# Patient Record
Sex: Male | Born: 1935 | Race: White | Hispanic: No | Smoking: Never smoker
Health system: Southern US, Community
[De-identification: ages and names within clinical notes are randomized; demographics above are authoritative.]

## PROBLEM LIST (undated history)

## (undated) DIAGNOSIS — I10 Essential (primary) hypertension: Secondary | ICD-10-CM

## (undated) DIAGNOSIS — I4891 Unspecified atrial fibrillation: Secondary | ICD-10-CM

## (undated) DIAGNOSIS — C189 Malignant neoplasm of colon, unspecified: Secondary | ICD-10-CM

## (undated) DIAGNOSIS — N189 Chronic kidney disease, unspecified: Secondary | ICD-10-CM

## (undated) DIAGNOSIS — K529 Noninfective gastroenteritis and colitis, unspecified: Secondary | ICD-10-CM

## (undated) DIAGNOSIS — I2510 Atherosclerotic heart disease of native coronary artery without angina pectoris: Secondary | ICD-10-CM

---

## 2012-01-21 DEATH — deceased

## 2016-11-17 ENCOUNTER — Encounter (HOSPITAL_COMMUNITY): Payer: Self-pay | Admitting: Physician Assistant

## 2016-11-17 ENCOUNTER — Inpatient Hospital Stay (HOSPITAL_COMMUNITY)
Admission: AD | Admit: 2016-11-17 | Discharge: 2016-11-23 | DRG: 375 | Disposition: A | Discharge status: Home Health | Payer: Medicare Other | Source: Other Acute Inpatient Hospital | Attending: Nephrology | Admitting: Nephrology

## 2016-11-17 DIAGNOSIS — I272 Pulmonary hypertension, unspecified: Secondary | ICD-10-CM | POA: Diagnosis present

## 2016-11-17 DIAGNOSIS — I25810 Atherosclerosis of coronary artery bypass graft(s) without angina pectoris: Secondary | ICD-10-CM | POA: Diagnosis not present

## 2016-11-17 DIAGNOSIS — K922 Gastrointestinal hemorrhage, unspecified: Secondary | ICD-10-CM | POA: Diagnosis present

## 2016-11-17 DIAGNOSIS — Z923 Personal history of irradiation: Secondary | ICD-10-CM

## 2016-11-17 DIAGNOSIS — J449 Chronic obstructive pulmonary disease, unspecified: Secondary | ICD-10-CM | POA: Diagnosis present

## 2016-11-17 DIAGNOSIS — I129 Hypertensive chronic kidney disease with stage 1 through stage 4 chronic kidney disease, or unspecified chronic kidney disease: Secondary | ICD-10-CM | POA: Diagnosis present

## 2016-11-17 DIAGNOSIS — Z7982 Long term (current) use of aspirin: Secondary | ICD-10-CM | POA: Diagnosis not present

## 2016-11-17 DIAGNOSIS — I10 Essential (primary) hypertension: Secondary | ICD-10-CM

## 2016-11-17 DIAGNOSIS — K921 Melena: Secondary | ICD-10-CM | POA: Diagnosis present

## 2016-11-17 DIAGNOSIS — D62 Acute posthemorrhagic anemia: Secondary | ICD-10-CM | POA: Diagnosis present

## 2016-11-17 DIAGNOSIS — C180 Malignant neoplasm of cecum: Secondary | ICD-10-CM | POA: Diagnosis not present

## 2016-11-17 DIAGNOSIS — I4891 Unspecified atrial fibrillation: Secondary | ICD-10-CM

## 2016-11-17 DIAGNOSIS — I959 Hypotension, unspecified: Secondary | ICD-10-CM | POA: Diagnosis present

## 2016-11-17 DIAGNOSIS — Z87891 Personal history of nicotine dependence: Secondary | ICD-10-CM

## 2016-11-17 DIAGNOSIS — K625 Hemorrhage of anus and rectum: Secondary | ICD-10-CM | POA: Diagnosis not present

## 2016-11-17 DIAGNOSIS — K317 Polyp of stomach and duodenum: Secondary | ICD-10-CM | POA: Diagnosis present

## 2016-11-17 DIAGNOSIS — N179 Acute kidney failure, unspecified: Secondary | ICD-10-CM | POA: Diagnosis present

## 2016-11-17 DIAGNOSIS — Z809 Family history of malignant neoplasm, unspecified: Secondary | ICD-10-CM | POA: Diagnosis not present

## 2016-11-17 DIAGNOSIS — R109 Unspecified abdominal pain: Secondary | ICD-10-CM

## 2016-11-17 DIAGNOSIS — C189 Malignant neoplasm of colon, unspecified: Principal | ICD-10-CM | POA: Diagnosis present

## 2016-11-17 DIAGNOSIS — I480 Paroxysmal atrial fibrillation: Secondary | ICD-10-CM | POA: Diagnosis present

## 2016-11-17 DIAGNOSIS — N184 Chronic kidney disease, stage 4 (severe): Secondary | ICD-10-CM | POA: Diagnosis present

## 2016-11-17 DIAGNOSIS — R1031 Right lower quadrant pain: Secondary | ICD-10-CM | POA: Diagnosis not present

## 2016-11-17 DIAGNOSIS — K2970 Gastritis, unspecified, without bleeding: Secondary | ICD-10-CM | POA: Diagnosis present

## 2016-11-17 DIAGNOSIS — K5150 Left sided colitis without complications: Secondary | ICD-10-CM | POA: Diagnosis present

## 2016-11-17 DIAGNOSIS — N189 Chronic kidney disease, unspecified: Secondary | ICD-10-CM | POA: Insufficient documentation

## 2016-11-17 HISTORY — DX: Unspecified atrial fibrillation: I48.91

## 2016-11-17 HISTORY — DX: Essential (primary) hypertension: I10

## 2016-11-17 HISTORY — DX: Noninfective gastroenteritis and colitis, unspecified: K52.9

## 2016-11-17 HISTORY — DX: Atherosclerotic heart disease of native coronary artery without angina pectoris: I25.10

## 2016-11-17 HISTORY — DX: Chronic kidney disease, unspecified: N18.9

## 2016-11-17 HISTORY — DX: Malignant neoplasm of colon, unspecified: C18.9

## 2016-11-17 LAB — COMPREHENSIVE METABOLIC PANEL
ALK PHOS: 38 U/L (ref 38–126)
ALT: 8 U/L — ABNORMAL LOW (ref 17–63)
ANION GAP: 11 (ref 5–15)
AST: 15 U/L (ref 15–41)
Albumin: 2.4 g/dL — ABNORMAL LOW (ref 3.5–5.0)
BUN: 47 mg/dL — ABNORMAL HIGH (ref 6–20)
CALCIUM: 8.6 mg/dL — AB (ref 8.9–10.3)
CO2: 21 mmol/L — ABNORMAL LOW (ref 22–32)
Chloride: 102 mmol/L (ref 101–111)
Creatinine, Ser: 2.84 mg/dL — ABNORMAL HIGH (ref 0.61–1.24)
GFR, EST AFRICAN AMERICAN: 20 mL/min — AB (ref >60)
GFR, EST NON AFRICAN AMERICAN: 18 mL/min — AB (ref >60)
GLUCOSE: 94 mg/dL (ref 65–99)
POTASSIUM: 4.5 mmol/L (ref 3.5–5.1)
Sodium: 134 mmol/L — ABNORMAL LOW (ref 135–145)
TOTAL PROTEIN: 5.4 g/dL — AB (ref 6.5–8.1)
Total Bilirubin: 0.4 mg/dL (ref 0.3–1.2)

## 2016-11-17 LAB — CBC WITH DIFFERENTIAL/PLATELET
BASOS ABS: 0 10*3/uL (ref 0.0–0.1)
BASOS PCT: 0 %
Eosinophils Absolute: 0.3 10*3/uL (ref 0.0–0.7)
Eosinophils Relative: 2 %
HEMATOCRIT: 27 % — AB (ref 39.0–52.0)
HEMOGLOBIN: 8.8 g/dL — AB (ref 13.0–17.0)
LYMPHS PCT: 10 %
Lymphs Abs: 1.5 10*3/uL (ref 0.7–4.0)
MCH: 31.9 pg (ref 26.0–34.0)
MCHC: 32.6 g/dL (ref 30.0–36.0)
MCV: 97.8 fL (ref 78.0–100.0)
Monocytes Absolute: 0.9 10*3/uL (ref 0.1–1.0)
Monocytes Relative: 6 %
NEUTROS ABS: 13.2 10*3/uL — AB (ref 1.7–7.7)
NEUTROS PCT: 82 %
Platelets: 245 10*3/uL (ref 150–400)
RBC: 2.76 MIL/uL — AB (ref 4.22–5.81)
RDW: 13.5 % (ref 11.5–15.5)
WBC: 16 10*3/uL — AB (ref 4.0–10.5)

## 2016-11-17 LAB — PROTIME-INR
INR: 1.13
Prothrombin Time: 14.5 seconds (ref 11.4–15.2)

## 2016-11-17 LAB — GLUCOSE, CAPILLARY
Glucose-Capillary: 86 mg/dL (ref 65–99)
Glucose-Capillary: 114 mg/dL — ABNORMAL HIGH (ref 65–99)
Glucose-Capillary: 101 mg/dL — ABNORMAL HIGH (ref 65–99)

## 2016-11-17 LAB — HEMATOCRIT
HEMATOCRIT: 27.4 % — AB (ref 39.0–52.0)
HEMATOCRIT: 26 % — AB (ref 39.0–52.0)

## 2016-11-17 LAB — ABO/RH: ABO/RH(D): A POS

## 2016-11-17 MED ORDER — NITROGLYCERIN 0.4 MG SL SUBL: 0.4000 mg | SUBLINGUAL_TABLET | SUBLINGUAL | Status: DC | PRN

## 2016-11-17 MED ORDER — OMEGA-3-ACID ETHYL ESTERS 1 G PO CAPS
1.0000 g | ORAL_CAPSULE | Freq: Every day | ORAL | Status: DC
  Administered 2016-11-17 – 2016-11-23 (×6): 1 g via ORAL
  Filled 2016-11-17 (×7): qty 1

## 2016-11-17 MED ORDER — TRAZODONE HCL 50 MG PO TABS
25.0000 mg | ORAL_TABLET | Freq: Every evening | ORAL | Status: DC | PRN
  Filled 2016-11-17: qty 1

## 2016-11-17 MED ORDER — ONDANSETRON HCL 4 MG PO TABS: 4.0000 mg | ORAL_TABLET | Freq: Four times a day (QID) | ORAL | Status: DC | PRN

## 2016-11-17 MED ORDER — ACETAMINOPHEN 500 MG PO TABS
500.0000 mg | ORAL_TABLET | Freq: Every evening | ORAL | Status: DC | PRN
  Filled 2016-11-17: qty 1

## 2016-11-17 MED ORDER — PIPERACILLIN-TAZOBACTAM IN DEX 2-0.25 GM/50ML IV SOLN
2.2500 g | Freq: Three times a day (TID) | INTRAVENOUS | Status: DC
  Administered 2016-11-17 – 2016-11-23 (×18): 2.25 g via INTRAVENOUS
  Filled 2016-11-17 (×19): qty 50

## 2016-11-17 MED ORDER — SODIUM CHLORIDE 0.9 % IV SOLN: Freq: Once | INTRAVENOUS | Status: DC

## 2016-11-17 MED ORDER — ACETAMINOPHEN 325 MG PO TABS: 650.0000 mg | ORAL_TABLET | Freq: Four times a day (QID) | ORAL | Status: DC | PRN

## 2016-11-17 MED ORDER — PANTOPRAZOLE SODIUM 40 MG PO TBEC
40.0000 mg | DELAYED_RELEASE_TABLET | Freq: Every day | ORAL | Status: DC
  Administered 2016-11-17: 40 mg via ORAL
  Filled 2016-11-17: qty 1

## 2016-11-17 MED ORDER — FUROSEMIDE 20 MG PO TABS
20.0000 mg | ORAL_TABLET | Freq: Every day | ORAL | Status: DC
  Administered 2016-11-17 – 2016-11-18 (×2): 20 mg via ORAL
  Filled 2016-11-17 (×2): qty 1

## 2016-11-17 MED ORDER — ONDANSETRON HCL 4 MG/2ML IJ SOLN: 4.0000 mg | Freq: Four times a day (QID) | INTRAMUSCULAR | Status: DC | PRN

## 2016-11-17 MED ORDER — SODIUM CHLORIDE 0.9 % IV SOLN
INTRAVENOUS | Status: DC
  Administered 2016-11-17 (×2): via INTRAVENOUS

## 2016-11-17 MED ORDER — ACETAMINOPHEN 650 MG RE SUPP: 650.0000 mg | Freq: Four times a day (QID) | RECTAL | Status: DC | PRN

## 2016-11-17 MED ORDER — PANTOPRAZOLE SODIUM 40 MG IV SOLR
40.0000 mg | Freq: Two times a day (BID) | INTRAVENOUS | Status: DC
  Administered 2016-11-17 – 2016-11-20 (×7): 40 mg via INTRAVENOUS
  Filled 2016-11-17 (×7): qty 40

## 2016-11-17 MED ORDER — PIPERACILLIN-TAZOBACTAM 3.375 G IVPB 30 MIN
3.3750 g | Freq: Three times a day (TID) | INTRAVENOUS | Status: DC
  Filled 2016-11-17 (×2): qty 50

## 2016-11-17 MED ORDER — METOPROLOL TARTRATE 50 MG PO TABS
75.0000 mg | ORAL_TABLET | Freq: Two times a day (BID) | ORAL | Status: DC
  Administered 2016-11-17 – 2016-11-18 (×2): 75 mg via ORAL
  Filled 2016-11-17 (×2): qty 1

## 2016-11-17 NOTE — Evaluation (Signed)
Physical Therapy Evaluation Patient Details Name: Leroy Soto MRN: [id number] DOB: 1920/06/22 Today's Date: 11/17/2016   History of Present Illness  pt is 83 y/o male with pmh of HTN, colon CA, s/p rdx, colitis 10 days PTA, presenting from outside hospital due to acute GIB/bloody diarrhea.  Clinical Impression  Pt admitted with/for GIB. Pt notably weak and needs min assist for mobility.  Pt currently limited functionally due to the problems listed below.  (see problems list.)  Pt will benefit from PT to maximize function and safety to be able to get home safely with available assist of family.     Follow Up Recommendations Home health PT;Supervision for mobility/OOB    Equipment Recommendations  None recommended by PT    Recommendations for Other Services       Precautions / Restrictions        Mobility  Bed Mobility Overal bed mobility: Needs Assistance Bed Mobility: Supine to Sit;Sit to Supine     Supine to sit: Min assist Sit to supine: Min guard   General bed mobility comments: stuggled coming OOB  Transfers Overall transfer level: Needs assistance   Transfers: Sit to/from Stand Sit to Stand: Min assist         General transfer comment: cues for hand placement  Ambulation/Gait Ambulation/Gait assistance: Min assist Ambulation Distance (Feet): 25 Feet Assistive device: Rolling walker (2 wheeled) Gait Pattern/deviations: Step-through pattern Gait velocity: slower Gait velocity interpretation: Below normal speed for age/gender General Gait Details: weak-kneed gait with mild knee flexion consistently.  Moderate use of the RW and stability assist overall.  Stairs            Wheelchair Mobility    Modified Rankin (Stroke Patients Only)       Balance Overall balance assessment: Needs assistance   Sitting balance-Leahy Scale: Good       Standing balance-Leahy Scale: Poor Standing balance comment: reliant on the RW                              Pertinent Vitals/Pain Pain Assessment: No/denies pain    Home Living Family/patient expects to be discharged to:: Private residence Living Arrangements: Children Available Help at Discharge: Family;Available 24 hours/day Type of Home: House Home Access: Stairs to enter Entrance Stairs-Rails: Psychiatric nurse of Steps: 3 Home Layout: One level Home Equipment: Shower seat;Other (comment);Walker - 2 wheels;Cane - single point (toilet riser with handles)      Prior Function Level of Independence: Independent with assistive device(s)               Hand Dominance        Extremity/Trunk Assessment   Upper Extremity Assessment Upper Extremity Assessment: Defer to OT evaluation    Lower Extremity Assessment Lower Extremity Assessment: Overall WFL for tasks assessed (proximal weakness--hip flexors, hams.)       Communication   Communication: No difficulties;HOH  Cognition Arousal/Alertness: Awake/alert Behavior During Therapy: WFL for tasks assessed/performed Overall Cognitive Status: Within Functional Limits for tasks assessed                      General Comments      Exercises     Assessment/Plan    PT Assessment Patient needs continued PT services  PT Problem List Decreased strength;Decreased activity tolerance;Decreased balance;Decreased mobility;Decreased knowledge of use of DME          PT Treatment Interventions Gait training;Stair training;Functional  mobility training;DME instruction;Therapeutic activities;Balance training;Patient/family education    PT Goals (Current goals can be found in the Care Plan section)  Acute Rehab PT Goals Patient Stated Goal: Get home....get stronger and give [place] some information PT Goal Formulation: With patient/family Time For Goal Achievement: 12/01/16 Potential to Achieve Goals: Good    Frequency Min 3X/week   Barriers to discharge        Co-evaluation                End of Session     Patient left: in bed;with call bell/phone within reach;with family/visitor present Nurse Communication: Mobility status         Time: [id number] PT Time Calculation (min) (ACUTE ONLY): 27 min   Charges:   PT Evaluation $PT Eval Moderate Complexity: 1 Procedure PT Treatments $Gait Training: 8-22 mins   PT G CodesTessie Fass Camreigh Michie 11/17/2016, 4:00 PM 11/17/2016  Donnella Sham, [place] [phone]  (pager)

## 2016-11-17 NOTE — H&P (Signed)
History and Physical    Maximum Murnan [id number] DOB: 02/13/1920 DOA: 11/17/2016  PCP: Default, Provider, MD   Patient coming from: [facility]  Chief Complaint: abdominal apin, nausea, bloody stools  HPI: Leroy Soto is a 83 y.o. male with medical history significant of HTN  [place] - onset at 8:20 pm. In the outside facility his blood work demonstrated anemia with Hgb 9.0., WBC count of 19,790, creatinine of 2.78, BUN 53 and Na 133 Rectal exam was grossly bloody. He was transferred to the [facility] d/t absence of on-call GI specialist in [facility] Patient has been seen by PCP on 11/09/2015 for abdominal pain, underwent abdominal CT showing left sided colitis, started on oral Cipro an Flagyl with improvement of symptoms after almost 10-day course of antibiotic therapy  Review of Systems: As per HPI otherwise 10 point review of systems negative.   Ambulatory Status: ambulated with walker at home and cane outside  Past Medical History:  Diagnosis Date  . A-fib ([place])   . CAD (coronary artery disease)   . CKD (chronic kidney disease)   . Colitis   . Colon cancer ([place])   . HTN (hypertension)     No past surgical history on file.  Social History   Social History  . Marital status: Widowed    Spouse name: N/A  . Number of children: N/A  . Years of education: N/A   Occupational History  . Not on file.   Social History Main Topics  . Smoking status: Not on file  . Smokeless tobacco: Not on file  . Alcohol use Not on file  . Drug use: Unknown  . Sexual activity: Not on file   Other Topics Concern  . Not on file   Social History Narrative  . No narrative on file    No Known Allergies  Family History  Problem Relation Age of Onset  . Cancer Father     Prior to Admission medications   Not on File    Physical Exam: Vitals:   11/17/16 0901  BP: 116/67  Pulse: 88    Resp: 18  Temp: 98.6 F (37 C)  TempSrc: Oral  SpO2: 98%  Weight: 68.8 kg (151 lb 10.8 oz)     General: Appears calm and comfortable, but pale and ill llooking Eyes: PERRLA, EOMI, normal lids, iris ENT:  decreased hearing, lips & tongue, mucous membranes moist and intact Neck: no lymphoadenopathy, masses or thyromegaly Cardiovascular: RRR, no m/r/g. No JVD, carotid bruits. No LE edema.  Respiratory: bilateral no wheezes, rales, rhonchi or cracles. Normal respiratory effort. No accessory muscle use observed Abdomen: mildly distended, tender to palpation in the LLQ, no organomegaly or masses appreciated. BS present in all quadrants  Skin: no rash, ulcers or induration seen on limited exam Musculoskeletal: grossly normal tone BUE/BLE, good ROM, no bony abnormality or joint deformities observed Psychiatric: grossly normal mood and affect, speech fluent and appropriate, alert and oriented x3 Neurologic: CN II-XII grossly intact, moves all extremities in coordinated fashion, sensation intact  Labs on Admission: I have personally reviewed following labs and imaging studies  CBC, BMP  GFR: CrCl cannot be calculated (Unknown ideal weight.).   Creatinine Clearance: CrCl cannot be calculated (Unknown ideal weight.).   Radiological Exams on Admission: No results found.  EKG: Independently reviewed - pending Assessment/Plan Principal Problem:   GIB (gastrointestinal bleeding) Active Problems:   Acute blood loss anemia  HTN (hypertension)   CKD (chronic kidney disease)   CAD (coronary artery disease) of artery bypass graft   A-fib ([facility])   GIB with associated blood loss anemia Continuee clear liquid diet, provide pain control and antiwmwtics if needed Contact GI service for consultation Will type and screen RBC's in case patient requires transfusion Continue to monitor HH q 6 hours and transfuse if Hgb drops to 8.0 or patient becomes hemodynamically unstable   Abdominal  pain Patient is still tender to palpation in the LLQ and has diffuse mild distension Will repeat abdominal CT and start IV Zosyn given his elevated WBC count  CKD - baseline creatinine is unknown Will lcontinue to monitor renal indices. Avoid nephrotoxic drugs  Hypertension - currently stable Continue home medication and adjust the doses if needed depending on the BP readings  Afib EKG is pending, HR is controlled presently Patient was treated with Eliquis, but lately it was changed to full dose Aspirin Will hold aspirin in light of active GI bleeding   DVT prophylaxis: SCd's Code Status: Full Family Communication: none Disposition Plan: MedSurg Consults called: GI Admission status: Inpatient   York Grice, [place] Pager: [phone] [facility]  If 7PM-7AM, please contact night-coverage [email or web address] Password [facility]  11/17/2016, 12:07 PM

## 2016-11-17 NOTE — Progress Notes (Signed)
Pharmacy Antibiotic Note  Leroy Soto is a 83 y.o. male admitted on 11/17/2016 with GI bleed and concern for intra-abdominal infection.  Pharmacy has been consulted for zosyn  dosing.  Plan: -Zosyn 2.25g Q8H with CrCl <20 -Monitor renal function, patient progress, LOT  Weight: 151 lb 10.8 oz (68.8 kg)  Temp (24hrs), Avg:98.6 F (37 C), Min:98.6 F (37 C), Max:98.6 F (37 C)   Recent Labs Lab 11/17/16 1002  WBC 16.0*  CREATININE 2.84*    CrCl cannot be calculated (Unknown ideal weight.).    No Known Allergies  Antimicrobials this admission: Zosyn 1/27 >>  Dose adjustments this admission: none  Microbiology results: none  Thank you for allowing pharmacy to be a part of this patient's care.  Myer Peer Grayland Ormond), PharmD  PGY1 Pharmacy Resident Pager: [phone] 11/17/2016 3:07 PM

## 2016-11-18 ENCOUNTER — Encounter (HOSPITAL_COMMUNITY): Payer: Self-pay | Admitting: *Deleted

## 2016-11-18 ENCOUNTER — Inpatient Hospital Stay (HOSPITAL_COMMUNITY): Payer: Medicare Other

## 2016-11-18 DIAGNOSIS — K922 Gastrointestinal hemorrhage, unspecified: Secondary | ICD-10-CM | POA: Diagnosis present

## 2016-11-18 DIAGNOSIS — R1031 Right lower quadrant pain: Secondary | ICD-10-CM

## 2016-11-18 DIAGNOSIS — R935 Abnormal findings on diagnostic imaging of other abdominal regions, including retroperitoneum: Secondary | ICD-10-CM

## 2016-11-18 DIAGNOSIS — C180 Malignant neoplasm of cecum: Secondary | ICD-10-CM

## 2016-11-18 DIAGNOSIS — D62 Acute posthemorrhagic anemia: Secondary | ICD-10-CM

## 2016-11-18 DIAGNOSIS — K921 Melena: Secondary | ICD-10-CM

## 2016-11-18 LAB — COMPREHENSIVE METABOLIC PANEL
ALT: 9 U/L — AB (ref 17–63)
AST: 16 U/L (ref 15–41)
Albumin: 2.2 g/dL — ABNORMAL LOW (ref 3.5–5.0)
Alkaline Phosphatase: 36 U/L — ABNORMAL LOW (ref 38–126)
Anion gap: 8 (ref 5–15)
BUN: 38 mg/dL — ABNORMAL HIGH (ref 6–20)
CHLORIDE: 107 mmol/L (ref 101–111)
CO2: 18 mmol/L — AB (ref 22–32)
CREATININE: 2.42 mg/dL — AB (ref 0.61–1.24)
Calcium: 8 mg/dL — ABNORMAL LOW (ref 8.9–10.3)
GFR calc Af Amer: 25 mL/min — ABNORMAL LOW (ref >60)
GFR calc non Af Amer: 21 mL/min — ABNORMAL LOW (ref >60)
Glucose, Bld: 87 mg/dL (ref 65–99)
Potassium: 4.2 mmol/L (ref 3.5–5.1)
Sodium: 133 mmol/L — ABNORMAL LOW (ref 135–145)
Total Bilirubin: 0.6 mg/dL (ref 0.3–1.2)
Total Protein: 5.1 g/dL — ABNORMAL LOW (ref 6.5–8.1)

## 2016-11-18 LAB — CBC
HCT: 25.6 % — ABNORMAL LOW (ref 39.0–52.0)
HEMATOCRIT: 25.6 % — AB (ref 39.0–52.0)
Hemoglobin: 8.4 g/dL — ABNORMAL LOW (ref 13.0–17.0)
Hemoglobin: 8.2 g/dL — ABNORMAL LOW (ref 13.0–17.0)
MCH: 32.4 pg (ref 26.0–34.0)
MCH: 32 pg (ref 26.0–34.0)
MCHC: 32.8 g/dL (ref 30.0–36.0)
MCHC: 32 g/dL (ref 30.0–36.0)
MCV: 98.8 fL (ref 78.0–100.0)
MCV: 100 fL (ref 78.0–100.0)
PLATELETS: 267 10*3/uL (ref 150–400)
Platelets: 290 10*3/uL (ref 150–400)
RBC: 2.59 MIL/uL — ABNORMAL LOW (ref 4.22–5.81)
RBC: 2.56 MIL/uL — ABNORMAL LOW (ref 4.22–5.81)
RDW: 14.1 % (ref 11.5–15.5)
RDW: 14 % (ref 11.5–15.5)
WBC: 17.7 10*3/uL — ABNORMAL HIGH (ref 4.0–10.5)
WBC: 21.9 10*3/uL — AB (ref 4.0–10.5)

## 2016-11-18 LAB — GLUCOSE, CAPILLARY
GLUCOSE-CAPILLARY: 87 mg/dL (ref 65–99)
Glucose-Capillary: 72 mg/dL (ref 65–99)
Glucose-Capillary: 103 mg/dL — ABNORMAL HIGH (ref 65–99)
Glucose-Capillary: 105 mg/dL — ABNORMAL HIGH (ref 65–99)

## 2016-11-18 LAB — HEMATOCRIT: HCT: 26.1 % — ABNORMAL LOW (ref 39.0–52.0)

## 2016-11-18 MED ORDER — LEVALBUTEROL HCL 0.63 MG/3ML IN NEBU: 0.6300 mg | INHALATION_SOLUTION | Freq: Four times a day (QID) | RESPIRATORY_TRACT | Status: DC | PRN

## 2016-11-18 MED ORDER — IOPAMIDOL (ISOVUE-300) INJECTION 61%
INTRAVENOUS | Status: AC
  Administered 2016-11-18: 30 mL
  Filled 2016-11-18: qty 30

## 2016-11-18 MED ORDER — METOPROLOL TARTRATE 50 MG PO TABS
75.0000 mg | ORAL_TABLET | Freq: Two times a day (BID) | ORAL | Status: DC
  Filled 2016-11-18: qty 1

## 2016-11-18 MED ORDER — SODIUM CHLORIDE 0.9 % IV SOLN
INTRAVENOUS | Status: DC
  Administered 2016-11-19: 01:00:00 via INTRAVENOUS

## 2016-11-18 MED ORDER — IPRATROPIUM-ALBUTEROL 0.5-2.5 (3) MG/3ML IN SOLN
3.0000 mL | RESPIRATORY_TRACT | Status: DC
  Filled 2016-11-18: qty 3

## 2016-11-18 MED ORDER — ALBUTEROL SULFATE (2.5 MG/3ML) 0.083% IN NEBU: 2.5000 mg | INHALATION_SOLUTION | RESPIRATORY_TRACT | Status: DC | PRN

## 2016-11-18 MED ORDER — METHYLPREDNISOLONE SODIUM SUCC 125 MG IJ SOLR
80.0000 mg | Freq: Once | INTRAMUSCULAR | Status: AC
  Administered 2016-11-18: 80 mg via INTRAVENOUS
  Filled 2016-11-18: qty 2

## 2016-11-18 NOTE — Progress Notes (Signed)
PROGRESS NOTE                                                                                                                                                                                                             Patient Demographics:    Leroy Soto, is a 83 y.o. male, DOB - 18-Sep-1920, [id number]  Admit date - 11/17/2016   Admitting Physician Maren Reamer, MD  Outpatient Primary MD for the patient is Default, Provider, MD  LOS - 1  Outpatient Specialists: [place]  No chief complaint on file.      Brief Narrative   83 year old male with history of hypertension, CK D stage III, moderate arthritis, history of infected hip, most recent echo 2016 with EF 50%, severe pulmonary hypertension, history of cecal adenocarcinoma status post radiation therapy, he is here stress test 1/17 positive for ischemia, EF 33%,. - Agent presents from [facility] ED with complaints of bloody diarrhea, baseline hemoglobin 10.5 last week, was 9 today, seen by GI, as well patient recently on Cipro and Flagyl by PCP for treatment of colitis.   Subjective:    Leroy Soto today has, No headache, No chest pain, No abdominal pain - No Nausea, Report some shortness of breath, had some wheezing   Assessment  & Plan :    Principal Problem:   GIB (gastrointestinal bleeding) Active Problems:   Acute blood loss anemia   HTN (hypertension)   CKD (chronic kidney disease)   CAD (coronary artery disease) of artery bypass graft   A-fib (HCC)   Abdominal pain   GI bleed   GI bleed with blood loss anemia - GI input greatly appreciated, history of colon cancer in February 2017, status post palliative radiation, not a surgical candidate. - Hemoccult-positive stool , glycohemoglobin 10.5 last week, it is 8.4 today. - Probably upper GI bleed given dark-colored stool, heme positive, and plan for EGD per GI service, continue with clear liquid diet, meanwhile continue with  PPI. - Monitor H&H closely and transfuse as needed   Colitis - Patient was started on Cipro and Flagyl by PCP a days ago, repeat CT abdomen pelvis today significant for right-sided colitis infectious versus cancer. - Continue Zosyn.  COPD - Recently quit smoking 4 years ago, CT significant for interstitial lung disease, some wheezing today, will start on DuoNeb, will give  one dose of Solu-Medrol.   AKI on CKD stage IV - Baseline creatinine is 2.1,it is 2.8 on admission, improving with IV fluids  A. Fib - Not anticoagulation candidate given GI bleed, currently aspirin on hold  Hypertension - Soft blood pressure, will put parameters for metoprolol  Code Status : full  Family Communication  : Son at bedside  Disposition Plan  : Home [place]  Consults  :  GI  Procedures  : none  DVT Prophylaxis  :   SCDs   Lab Results  Component Value Date   PLT 267 11/18/2016    Antibiotics  :    Anti-infectives    Start     Dose/Rate Route Frequency Ordered Stop   11/17/16 1500  piperacillin-tazobactam (ZOSYN) IVPB 2.25 g     2.25 g 100 mL/hr over 30 Minutes Intravenous Every 8 hours 11/17/16 1459     11/17/16 1400  piperacillin-tazobactam (ZOSYN) IVPB 3.375 g  Status:  Discontinued     3.375 g 12.5 mL/hr over 240 Minutes Intravenous Every 8 hours 11/17/16 1245 11/17/16 1459        Objective:   Vitals:   11/17/16 1857 11/17/16 2040 11/18/16 0606 11/18/16 0854  BP:  109/67 (!) 91/58 111/63  Pulse:  74 78 86  Resp:  16 16 18   Temp:   98.4 F (36.9 C) 98.6 F (37 C)  TempSrc:   Oral Oral  SpO2:  99% 99% 98%  Weight:  68.1 kg (150 lb 2.1 oz)    Height: 5\' 10"  (1.778 m)       Wt Readings from Last 3 Encounters:  11/17/16 68.1 kg (150 lb 2.1 oz)     Intake/Output Summary (Last 24 hours) at 11/18/16 1534 Last data filed at 11/18/16 1135  Gross per 24 hour  Intake             3060 ml  Output             1200 ml  Net             1860 ml     Physical  Exam  Awake Alert, Oriented X 3Extremely hard of hearing Supple Neck,No JVD,  Symmetrical Chest wall movement, Good air movement bilaterally, scattered bilateral wheezing RRR,No Gallops,Rubs or new Murmurs, No Parasternal Heave +ve B.Sounds, Abd Soft, No tenderness, No rebound - guarding or rigidity. No Cyanosis, Clubbing or edema, No new Rash or bruise      Data Review:    CBC  Recent Labs Lab 11/17/16 1002 11/17/16 1448 11/17/16 2128 11/18/16 0430 11/18/16 1044  WBC 16.0*  --   --  17.7*  --   HGB 8.8*  --   --  8.4*  --   HCT 27.0* 27.4* 26.0* 25.6* 26.1*  PLT 245  --   --  267  --   MCV 97.8  --   --  98.8  --   MCH 31.9  --   --  32.4  --   MCHC 32.6  --   --  32.8  --   RDW 13.5  --   --  14.1  --   LYMPHSABS 1.5  --   --   --   --   MONOABS 0.9  --   --   --   --   EOSABS 0.3  --   --   --   --   BASOSABS 0.0  --   --   --   --  Chemistries   Recent Labs Lab 11/17/16 1002 11/18/16 0430  NA 134* 133*  K 4.5 4.2  CL 102 107  CO2 21* 18*  GLUCOSE 94 87  BUN 47* 38*  CREATININE 2.84* 2.42*  CALCIUM 8.6* 8.0*  AST 15 16  ALT 8* 9*  ALKPHOS 38 36*  BILITOT 0.4 0.6   ------------------------------------------------------------------------------------------------------------------ No results for input(s): CHOL, HDL, LDLCALC, TRIG, CHOLHDL, LDLDIRECT in the last 72 hours.  No results found for: HGBA1C ------------------------------------------------------------------------------------------------------------------ No results for input(s): TSH, T4TOTAL, T3FREE, THYROIDAB in the last 72 hours.  Invalid input(s): FREET3 ------------------------------------------------------------------------------------------------------------------ No results for input(s): VITAMINB12, FOLATE, FERRITIN, TIBC, IRON, RETICCTPCT in the last 72 hours.  Coagulation profile  Recent Labs Lab 11/17/16 1002  INR 1.13    No results for input(s): DDIMER in the last 72  hours.  Cardiac Enzymes No results for input(s): CKMB, TROPONINI, MYOGLOBIN in the last 168 hours.  Invalid input(s): CK ------------------------------------------------------------------------------------------------------------------ No results found for: BNP  Inpatient Medications  Scheduled Meds: . sodium chloride   Intravenous Once  . furosemide  20 mg Oral Daily  . methylPREDNISolone (SOLU-MEDROL) injection  80 mg Intravenous Once  . metoprolol  75 mg Oral BID WC  . omega-3 acid ethyl esters  1 g Oral Daily  . pantoprazole (PROTONIX) IV  40 mg Intravenous Q12H  . piperacillin-tazobactam (ZOSYN)  IV  2.25 g Intravenous Q8H   Continuous Infusions: . sodium chloride     PRN Meds:.acetaminophen **OR** acetaminophen, acetaminophen, levalbuterol, ondansetron **OR** ondansetron (ZOFRAN) IV, traZODone  Micro Results No results found for this or any previous visit (from the past 240 hour(s)).  Radiology Reports Ct Abdomen Pelvis Wo Contrast  Result Date: 11/18/2016 CLINICAL DATA:  Right-sided abdominal pain. History: Heads in 2017. Previous colonic perforation during colonoscopy. EXAM: CT ABDOMEN AND PELVIS WITHOUT CONTRAST TECHNIQUE: Multidetector CT imaging of the abdomen and pelvis was performed following the standard protocol without IV contrast. COMPARISON:  None. FINDINGS: Lower chest: Chronic interstitial lung changes. Bibasilar peribronchial airspace consolidation versus atelectasis. Small left pleural effusion. Advanced calcific atherosclerotic disease of the coronary arteries. Calcific atherosclerotic disease of the aorta. Hepatobiliary: No focal liver abnormality is seen. No gallstones, gallbladder wall thickening, or biliary dilatation. Pancreas: Unremarkable. No pancreatic ductal dilatation or surrounding inflammatory changes. Spleen: Normal in size without focal abnormality. Adrenals/Urinary Tract: Adrenal glands demonstrate mild symmetric thickening. No evidence of  hydronephrosis. Bilateral nonobstructive nephrolithiasis with the largest left lower pole renal stone measuring 13 mm. Bilateral renal masses, incompletely evaluated due to lack of IV contrast. Stomach/Bowel: Stomach is within normal limits. No evidence of abnormal bowel dilation. Scattered left colonic diverticulosis. Mild circumferential mucosal thickening of the proximal descending colon. Irregular mucosal thickening of the cecum. No free gas or oral contrast extravasation into the abdomen. Vascular/Lymphatic: Right central mesenteric soft tissue masses likely represent conglomerate of abnormal lymph nodes the larger of the 2 measures 3.3 by 2.4 by 4.9 cm. Best seen on coronal image 40/sequence 5. Other smaller mesenteric lymph nodes, as well as retroperitoneal periaortic and pericaval lymph nodes are also seen. There is mesenteric stranding, predominantly in the right lower quadrant of the abdomen and small amount of free fluid. Calcific atherosclerotic disease of the aorta with mild fusiform dilation of the infrarenal aorta measuring up to 2.8 cm in diameter. Reproductive: Mildly enlarged prostate gland. Other: Fat containing right inguinal hernia.  Mild anasarca. Musculoskeletal: Age-indeterminate compression fracture of L2 vertebral body with approximately 50% height loss. Prior T9 vertebroplasty. IMPRESSION: Irregular mucosal thickening  of the cecum with pericecal mesenteric inflammatory changes and small amount of free fluid in the right lower quadrant of the abdomen. No evidence of contrast extravasation or free intra- abdominal gas. These findings may be due to cancer of the cecum or focal colitis. Given presence of 2 right central mesenteric soft tissue masses, likely representing lymphadenopathy, malignancy is of primary consideration. Mild smooth mucosal thickening of the descending colon, nonspecific. Scattered colonic diverticulosis. Calcific atherosclerotic disease of the aorta with mild fusiform  aneurysmal dilation of the infrarenal aorta. Age-indeterminate compression fracture of L2 vertebral body. Bilateral renal masses, incompletely evaluated due to lack of IV contrast. Chronic interstitial lung changes, bibasilar peribronchial airspace disease versus atelectasis. Small left pleural effusion. Electronically Signed   By: Fidela Salisbury M.D.   On: 11/18/2016 14:06     Shalece Staffa M.D on 11/18/2016 at 3:34 PM  Between 7am to 7pm - Pager - [phone]  After 7pm go to [email or web address] - password [facility]  [facility] -  Office  [phone]

## 2016-11-18 NOTE — Consult Note (Signed)
[facility] Consult: 9:14 AM 11/18/2016  LOS: 1 day    Referring Provider: Dr Waldron Labs  Primary Care Physician:  Default, Provider, MD Primary Gastroenterologist:  Althia Forts.      Consult for Dr Carol Ada Reason for Consultation:  Dark stools with blood.     HPI: Leroy Soto is a 83 y.o. male.  [facility]  History of colon perforation following polypectomy in 2008 requiring surgery. Workup for chronic anemia and heme positive stool with colonoscopy in 11/2015 revealed colon cancer, oncology elected to treat this with palliative radiation.  Patient's son tells me that this shrunk the tumor and that the tumor is not obstructing.  Paroxysmal atrial Fib, for which he takes full dose aspirin daily.  Not on anticoagulation. CAD.  HTN.    Patient treated for dehydration through the local emergency department on 10/28/16. His complaint was that of weakness.  His white count was elevated. Approximately 10 days later he developed discomfort in his right lower quadrant and would have periods of weakness concerning for his family. He was tender on exam per his GP and the white count was still elevated.  Abdominal CT 11/09/15 apparently showed left sided colitis.  He was treated with 10 days Cipro/Flagyl (about 2 days left on his prescription as of yesterday).  The discomfort improved didn't entirely resolve. Patient's been having periods of weakness. He's also had some constipation. He didn't have bowel movements for 2 or 3 days last week but started moving his bowels again later in the week. He had several looser bowel movements on Friday. By the fourth bowel movement that day, in the evening, the son saw that the stool was dark and there was penumbra of red-looking blood surrounding the  stool. This occurred once or twice more that evening and occurred again Saturday morning. They brought him to [facility] ED. + hypotension to 80/40. Not tachy.  Hgb 9 >> 8.8 >> 8.4 from [place] to [facility]. Macrocytic at 102. Coags and platelets normal.  + CKD/AKI.       Past Medical History:  Diagnosis Date  . A-fib ([place])   . CAD (coronary artery disease)   . CKD (chronic kidney disease)   . Colitis   . Colon cancer ([place])   . HTN (hypertension)     History reviewed. No pertinent surgical history.  Prior to Admission medications   Medication Sig Start Date End Date Taking? Authorizing Provider  acetaminophen (TYLENOL) 500 MG tablet Take 500 mg by mouth at bedtime as needed (shoulder pain).   Yes Historical Provider, MD  Ascorbic Acid (VITAMIN C) 1000 MG tablet Take 1,000 mg by mouth daily.   Yes Historical Provider, MD  aspirin EC 325 MG tablet Take 325 mg by mouth daily.   Yes Historical Provider, MD  Calcium Carbonate (CALCIUM 600 PO) Take 600 mg by mouth daily.   Yes Historical Provider, MD  ciprofloxacin (CIPRO) 500 MG tablet Take 500 mg by mouth 2 (two) times daily. 10 day course started 11/09/16 pm   Yes Historical Provider,  MD  ferrous sulfate 325 (65 FE) MG tablet Take 325 mg by mouth daily with breakfast.   Yes Historical Provider, MD  fluticasone (FLONASE) 50 MCG/ACT nasal spray Place 1 spray into both nostrils daily as needed (seasonal allergies).   Yes Historical Provider, MD  furosemide (LASIX) 20 MG tablet Take 20 mg by mouth daily.   Yes Historical Provider, MD  GLUCOSAMINE-CHONDROITIN PO Take 1 tablet by mouth daily.   Yes Historical Provider, MD  metoprolol (LOPRESSOR) 50 MG tablet Take 75 mg by mouth 2 (two) times daily with a meal.   Yes Historical Provider, MD  metroNIDAZOLE (FLAGYL) 500 MG tablet Take 500 mg by mouth every 8 (eight) hours. 10 day course started 11/09/16 pm   Yes Historical Provider, MD  Multiple Vitamin (MULTIVITAMIN WITH MINERALS) TABS tablet Take 1  tablet by mouth daily. Centrum Silver   Yes Historical Provider, MD  nitroGLYCERIN (NITROSTAT) 0.4 MG SL tablet Place 0.4 mg under the tongue every 5 (five) minutes as needed for chest pain.   Yes Historical Provider, MD  Omega-3 Fatty Acids (FISH OIL) 1000 MG CAPS Take 1,000 mg by mouth daily.   Yes Historical Provider, MD  pantoprazole (PROTONIX) 40 MG tablet Take 40 mg by mouth daily.   Yes Historical Provider, MD  Specialty Vitamins Products (ICAPS LUTEIN-ZEAXANTHIN PO) Take 1 capsule by mouth daily.   Yes Historical Provider, MD  vitamin E 400 UNIT capsule Take 400 Units by mouth daily.   Yes Historical Provider, MD    Scheduled Meds: . sodium chloride   Intravenous Once  . furosemide  20 mg Oral Daily  . metoprolol  75 mg Oral BID WC  . omega-3 acid ethyl esters  1 g Oral Daily  . pantoprazole (PROTONIX) IV  40 mg Intravenous Q12H  . piperacillin-tazobactam (ZOSYN)  IV  2.25 g Intravenous Q8H   Infusions: . sodium chloride 100 mL/hr at 11/17/16 2227   PRN Meds: acetaminophen **OR** acetaminophen, acetaminophen, ondansetron **OR** ondansetron (ZOFRAN) IV, traZODone   Allergies as of 11/17/2016  . (No Known Allergies)    Family History  Problem Relation Age of Onset  . Cancer Father     Social History   Social History  . Marital status: Widowed    Spouse name: N/A  . Number of children: N/A  . Years of education: N/A   Occupational History  . Not on file.   Social History Main Topics  . Smoking status: Never Smoker  . Smokeless tobacco: Never Used  . Alcohol use Not on file  . Drug use: Unknown  . Sexual activity: Not on file   Other Topics Concern  . Not on file   Social History Narrative  . No narrative on file    REVIEW OF SYSTEMS: Constitutional:  Weakness intermittently ENT:  No nose bleeds Pulm:  No SOB or cough CV:  No palpitations, no LE edema.  GU:  No hematuria, no frequency GI:  No heartburn, no dysphagia. Change of bowel habits as per  HPI.   Heme:  No bleeding or bruising tendencies   Transfusions:  In 2016 or early 2017 Neuro:  No headaches, no peripheral tingling or numbness Derm:  No itching, no rash or sores.  Endocrine:  No sweats or chills.  No polyuria or dysuria Immunization:  Did not query as to recent vaccines Travel:  None beyond local counties in last few months.    PHYSICAL EXAM: Vital signs in last 24 hours: Vitals:   11/18/16  0606 11/18/16 0854  BP: (!) 91/58 111/63  Pulse: 78 86  Resp: 16 18  Temp: 98.4 F (36.9 C) 98.6 F (37 C)   Wt Readings from Last 3 Encounters:  11/17/16 68.1 kg (150 lb 2.1 oz)    General: Aged, somewhat frail, alert WM. Head:  No asymmetry, no signs of head trauma.  Eyes:  No scleral icterus, no conjunctival pallor. EOMI. Arcus senilis Ears:  HOH.  Nose:  No discharge or congestion Mouth:  Clear, moist oral mucosa. Tongue is deeply rusty stained but it is from purple-colored contrast he is currently drinking Neck:  No JVD, no TMG. There is a firm node/mass about 1 cm in diameter on the left just below the angle between the body and ramus of the jawbone. Lungs:  Clear bilaterally. No labored breathing or cough. Heart: RRR. No MRG. S1, S2 present. Abdomen:  Soft. Slight focal tenderness in the right lower quadrant without guarding or rebound. Active bowel sounds. Abdomen is slightly tense and distended..   Rectal: Black, 3 plus FOBT positive stool. Large, very firm but smooth prostate. External hemorrhoidal tags. No red blood visible. No rectal masses.   Musc/Skeltl: Some kyphosis. No contracture deformities. Arthritic changes in the hands Extremities:  No CCE.  Neurologic:  Patient is alert and oriented times 3. He is able to provide history but a lot of the history was provided by a very well informed son. Skin:  No rashes or sores.   Psych:  Pleasant, cooperative, not depressed or anxious.   Intake/Output from previous day: 01/27 0701 - 01/28 0700 In: 2338.3  [P.O.:360; I.V.:1828.3; IV Piggyback:150] Out: 1150 [Urine:1150] Intake/Output this shift: No intake/output data recorded.  LAB RESULTS:  Recent Labs  11/17/16 1002 11/17/16 1448 11/17/16 2128 11/18/16 0430  WBC 16.0*  --   --  17.7*  HGB 8.8*  --   --  8.4*  HCT 27.0* 27.4* 26.0* 25.6*  PLT 245  --   --  267   BMET Lab Results  Component Value Date   NA 133 (L) 11/18/2016   NA 134 (L) 11/17/2016   K 4.2 11/18/2016   K 4.5 11/17/2016   CL 107 11/18/2016   CL 102 11/17/2016   CO2 18 (L) 11/18/2016   CO2 21 (L) 11/17/2016   GLUCOSE 87 11/18/2016   GLUCOSE 94 11/17/2016   BUN 38 (H) 11/18/2016   BUN 47 (H) 11/17/2016   CREATININE 2.42 (H) 11/18/2016   CREATININE 2.84 (H) 11/17/2016   CALCIUM 8.0 (L) 11/18/2016   CALCIUM 8.6 (L) 11/17/2016   LFT  Recent Labs  11/17/16 1002 11/18/16 0430  PROT 5.4* 5.1*  ALBUMIN 2.4* 2.2*  AST 15 16  ALT 8* 9*  ALKPHOS 38 36*  BILITOT 0.4 0.6   PT/INR Lab Results  Component Value Date   INR 1.13 11/17/2016   Hepatitis Panel No results for input(s): HEPBSAG, HCVAB, HEPAIGM, HEPBIGM in the last 72 hours. C-Diff No components found for: CDIFF Lipase  No results found for: LIPASE  Drugs of Abuse  No results found for: LABOPIA, COCAINSCRNUR, LABBENZ, AMPHETMU, THCU, LABBARB   RADIOLOGY STUDIES: No results found.    IMPRESSION:   *  Dark stool with blood.    Hx colon cancer dx 11/2015.  S/p palliative radiation .   Left sided colitis on CT 1/18. Right sided abdominal pain improved but did not completely resolve with 10 day course of Cipro/Flagyl (had about 2 days left on the prescription when he  was admitted yesterday.)   ? PUD given more melnic appearance to stool on my exam and family description of dark stool with blood in periphery.  On BID IV Protonix.   ? Radiation induced colon injury vs ischemic colitis versus bleeding from neoplasm..  Though his WBC count is 17,000, low suspicion for infectious colitis. Note  that patient is being treated with Zosyn.  *  Hx A fib, no anticoagulation therapy at home.   *  Anemia.  On chronic iron supplements.  Current hemoglobin has dropped about a half a gram within the last 24 hours.  *  CKD stage 4 with ? AKI.     PLAN:     *  CT abdomen and pelvis ordered, with oral contrast.  Can assess previously noted colitis as well as colon mass.    *  Given the appearance of the stool which is dark and heme positive, I think he probably needs to have upper endoscopy. This will be done tomorrow. In the meantime and to do that twice daily Protonix and continue clear liquid diet. We'll make the patient nothing by mouth after 5 AM tomorrow morning to allow for the endoscopy.  *  Drs. Benson Norway and Collene Mares will be assuming care for this patient on 11/19/16.  *  Repeat CBC this evening and again in the morning.  Azucena Freed  11/18/2016, 9:14 AM Pager: [phone]  GI ATTENDING  History, laboratories, and just completed CT scan reviewed. Patient personally seen and examined. Multiple family members in room. Agree with comprehensive consultation note as outlined above. Patient transferred in from outside for GI bleeding. Limited records. Family tells me that he had colonic perforation in the past from colonoscopy. Subsequently diagnosed with right-sided colon cancer for which she was treated with radiation only. Has been having right lower quadrant pain. Red blood and dark stools per rectum. Melena on exam today. Significant anemia is noted. I suspect that his bleeding and pain are both related to inadequately treated colon cancer. We will plan on upper endoscopy given the melenic appearance of stools to rule out ulcer or other treatable lesion. Poor prognosis. Discussed with Dr. Benson Norway who will assume GI care. Family aware.  Docia Chuck. Geri Seminole., M.D. [facility] Division of Gastroenterology

## 2016-11-19 ENCOUNTER — Encounter (HOSPITAL_COMMUNITY)
Admission: AD | Disposition: A | Discharge status: Home Health | Payer: Self-pay | Source: Other Acute Inpatient Hospital | Attending: Nephrology

## 2016-11-19 ENCOUNTER — Encounter (HOSPITAL_COMMUNITY): Payer: Self-pay | Admitting: *Deleted

## 2016-11-19 DIAGNOSIS — N189 Chronic kidney disease, unspecified: Secondary | ICD-10-CM

## 2016-11-19 DIAGNOSIS — K625 Hemorrhage of anus and rectum: Secondary | ICD-10-CM

## 2016-11-19 HISTORY — PX: ESOPHAGOGASTRODUODENOSCOPY: SHX5428

## 2016-11-19 LAB — BASIC METABOLIC PANEL
ANION GAP: 8 (ref 5–15)
BUN: 35 mg/dL — ABNORMAL HIGH (ref 6–20)
CHLORIDE: 107 mmol/L (ref 101–111)
CO2: 19 mmol/L — ABNORMAL LOW (ref 22–32)
CREATININE: 2.45 mg/dL — AB (ref 0.61–1.24)
Calcium: 8.2 mg/dL — ABNORMAL LOW (ref 8.9–10.3)
GFR calc non Af Amer: 21 mL/min — ABNORMAL LOW (ref >60)
GFR, EST AFRICAN AMERICAN: 24 mL/min — AB (ref >60)
Glucose, Bld: 127 mg/dL — ABNORMAL HIGH (ref 65–99)
Potassium: 4.6 mmol/L (ref 3.5–5.1)
SODIUM: 134 mmol/L — AB (ref 135–145)

## 2016-11-19 LAB — HEMOGLOBIN AND HEMATOCRIT, BLOOD
HEMATOCRIT: 20.9 % — AB (ref 39.0–52.0)
HEMOGLOBIN: 6.9 g/dL — AB (ref 13.0–17.0)

## 2016-11-19 LAB — GLUCOSE, CAPILLARY
GLUCOSE-CAPILLARY: 134 mg/dL — AB (ref 65–99)
GLUCOSE-CAPILLARY: 153 mg/dL — AB (ref 65–99)
Glucose-Capillary: 111 mg/dL — ABNORMAL HIGH (ref 65–99)
Glucose-Capillary: 139 mg/dL — ABNORMAL HIGH (ref 65–99)

## 2016-11-19 LAB — CBC
HEMATOCRIT: 21.7 % — AB (ref 39.0–52.0)
HEMOGLOBIN: 7.1 g/dL — AB (ref 13.0–17.0)
MCH: 32.3 pg (ref 26.0–34.0)
MCHC: 32.7 g/dL (ref 30.0–36.0)
MCV: 98.6 fL (ref 78.0–100.0)
Platelets: 269 10*3/uL (ref 150–400)
RBC: 2.2 MIL/uL — AB (ref 4.22–5.81)
RDW: 14.2 % (ref 11.5–15.5)
WBC: 18.7 10*3/uL — AB (ref 4.0–10.5)

## 2016-11-19 LAB — TYPE AND SCREEN
ABO/RH(D): A POS
Antibody Screen: NEGATIVE

## 2016-11-19 LAB — PREPARE RBC (CROSSMATCH)

## 2016-11-19 SURGERY — EGD (ESOPHAGOGASTRODUODENOSCOPY)
Anesthesia: Moderate Sedation

## 2016-11-19 MED ORDER — MIDAZOLAM HCL 5 MG/ML IJ SOLN
INTRAMUSCULAR | Status: AC
  Filled 2016-11-19: qty 2

## 2016-11-19 MED ORDER — FENTANYL CITRATE (PF) 100 MCG/2ML IJ SOLN
INTRAMUSCULAR | Status: DC | PRN
  Administered 2016-11-19: 12.5 ug via INTRAVENOUS

## 2016-11-19 MED ORDER — SODIUM CHLORIDE 0.9 % IV SOLN: Freq: Once | INTRAVENOUS | Status: DC

## 2016-11-19 MED ORDER — SODIUM CHLORIDE 0.9 % IV SOLN: INTRAVENOUS | Status: DC

## 2016-11-19 MED ORDER — MIDAZOLAM HCL 10 MG/2ML IJ SOLN
INTRAMUSCULAR | Status: DC | PRN
  Administered 2016-11-19: 1 mg via INTRAVENOUS

## 2016-11-19 MED ORDER — FENTANYL CITRATE (PF) 100 MCG/2ML IJ SOLN
INTRAMUSCULAR | Status: AC
  Filled 2016-11-19: qty 2

## 2016-11-19 NOTE — Progress Notes (Signed)
Physical Therapy Treatment Patient Details Name: Leroy Soto MRN: [id number] DOB: 08-14-1920 Today's Date: 11/19/2016    History of Present Illness pt is 83 y/o male with pmh of HTN, colon CA, s/p rdx, colitis 10 days PTA, presenting from outside hospital due to acute GIB/bloody diarrhea.    PT Comments    Continues to progress well despite GIB and declining Hgb.  To endoscopy today.   Follow Up Recommendations  Home health PT;Supervision for mobility/OOB     Equipment Recommendations  None recommended by PT    Recommendations for Other Services       Precautions / Restrictions Precautions Precautions: Fall    Mobility  Bed Mobility Overal bed mobility: Needs Assistance Bed Mobility: Supine to Sit;Sit to Supine     Supine to sit: Min guard Sit to supine: Min guard   General bed mobility comments: slower, but no struggle  Transfers Overall transfer level: Needs assistance Equipment used: Rolling walker (2 wheeled) Transfers: Sit to/from Stand Sit to Stand: Min assist         General transfer comment: cues for hand placement  Ambulation/Gait Ambulation/Gait assistance: Min assist;+2 safety/equipment Ambulation Distance (Feet): 140 Feet (then additional 140 feet and 60 feet with rest in b/w and BR) Assistive device: Rolling walker (2 wheeled) Gait Pattern/deviations: Step-through pattern Gait velocity: can increase speed to age appropriate levels Gait velocity interpretation: at or above normal speed for age/gender General Gait Details: general steadiness with RW, but notably fatiguing.  Sats on RA at 95-98%, but EHR at 95 up to 113 BPM    Stairs            Wheelchair Mobility    Modified Rankin (Stroke Patients Only)       Balance     Sitting balance-Leahy Scale: Good         Standing balance comment: reliant on the RW                    Cognition Arousal/Alertness: Awake/alert Behavior During Therapy: WFL for tasks  assessed/performed Overall Cognitive Status: Within Functional Limits for tasks assessed                      Exercises      General Comments General comments (skin integrity, edema, etc.): BRBPR, going for endoscopy      Pertinent Vitals/Pain Pain Assessment: No/denies pain    Home Living                      Prior Function            PT Goals (current goals can now be found in the care plan section) Acute Rehab PT Goals Patient Stated Goal: Get home....get stronger and give [place] some information PT Goal Formulation: With patient/family Time For Goal Achievement: 12/01/16 Potential to Achieve Goals: Good Progress towards PT goals: Progressing toward goals    Frequency    Min 3X/week      PT Plan Current plan remains appropriate    Co-evaluation             End of Session   Activity Tolerance: Patient tolerated treatment well       Time: [id number] PT Time Calculation (min) (ACUTE ONLY): 27 min  Charges:  $Gait Training: 8-22 mins $Therapeutic Activity: 8-22 mins                    G Codes:  Tessie Fass Clotee Schlicker 11/19/2016, 6:14 PM 11/19/2016  Donnella Sham, [place] [phone]  (pager)

## 2016-11-19 NOTE — Progress Notes (Signed)
PROGRESS NOTE    Leroy Soto  [id number] DOB: 08-07-1920 DOA: 11/17/2016 PCP: Default, Provider, MD   Brief Narrative: 83 y.o. male with a Past Medical History of  hypertension, history of colon cancer, status post radiation treatment last year, with findings as possible colitis s/p treatment with ciprofloxacin and Flagyl by his primary care doctor, now presents from [facility] ED with complaints of bloody diarrhea, anemia.   Assessment & Plan:  #  GI bleed, likely lower:  -Evaluated by gastroenterologist, plan for endoscopy today. -history of colon cancer in February 2017, status post palliative radiation, not a surgical candidate. -Currently nothing by mouth, IV fluid. Supportive care. -Also on Protonix IV twice a day.  #Acute blood loss anemia: Hemoglobin dropping to 7.1 today. Patient had 2 bloody bowel movement last night. Plan to transfuse a unit of blood today. Continue to monitor closely.  #Hypotension likely in the setting of bleeding and dehydration: Currently on IV fluid. Holding Lasix and metoprolol.  #Acute kidney injury versus Acute on CKD III: Baseline serum creatinine level unknown. On admission patient had serum creatinine level of 2.8 which is trending down to 2.4. Monitor BMP. Avoid nephrotoxins.  #History of atrial fibrillation: No anticoagulation because of bleeding. Continue to monitor. Metoprolol on hold because of hypotension.  Principal Problem:   GIB (gastrointestinal bleeding) Active Problems:   Acute blood loss anemia   HTN (hypertension)   CKD (chronic kidney disease)   CAD (coronary artery disease) of artery bypass graft   A-fib ([facility])   Abdominal pain   GI bleed  DVT prophylaxis: SCD. No anticoagulation because of bleeding Code Status: Full code Family Communication: Patient's son was at bedside. I discussed with him in detail. Disposition Plan: The discharge home in 1-2 days.    Consultants:   Gastroenterologist  Procedures:  Possible endoscopy today Antimicrobials: None  Subjective: Patient was seen and examined at bedside. Patient reported fatigue, weakness. Had 2 bloody bowel movement last night. Patient's son at bedside. Denied nausea vomiting chest pain or shortness of breath.  Objective: Vitals:   11/19/16 0359 11/19/16 0700 11/19/16 0800 11/19/16 1001  BP: (!) 87/59 (!) 89/54 (!) 74/60 119/73  Pulse: 83 87 91 92  Resp: 20   20  Temp: 98.6 F (37 C)   98 F (36.7 C)  TempSrc: Oral Oral  Oral  SpO2: 96% 97%  99%  Weight:      Height:        Intake/Output Summary (Last 24 hours) at 11/19/16 1308 Last data filed at 11/19/16 0900  Gross per 24 hour  Intake          1138.33 ml  Output              450 ml  Net           688.33 ml   Filed Weights   11/17/16 0901 11/17/16 2040  Weight: 68.8 kg (151 lb 10.8 oz) 68.1 kg (150 lb 2.1 oz)    Examination:  General exam: Elderly male lying in bed, Appears calm and comfortable  Respiratory system: Clear to auscultation. Respiratory effort normal. No wheezing or crackle Cardiovascular system: S1 & S2 heard, RRR.  No pedal edema. Gastrointestinal system: Abdomen is nondistended, soft and nontender. Normal bowel sounds heard. Central nervous system: Alert awake and following commands. Extremities: Symmetric 5 x 5 power. Skin: No rashes, lesions or ulcers Psychiatry: Judgement and insight appear normal.     Data Reviewed: I have personally reviewed following labs  and imaging studies  CBC:  Recent Labs Lab 11/17/16 1002  11/17/16 2128 11/18/16 0430 11/18/16 1044 11/18/16 1816 11/19/16 0522  WBC 16.0*  --   --  17.7*  --  21.9* 18.7*  NEUTROABS 13.2*  --   --   --   --   --   --   HGB 8.8*  --   --  8.4*  --  8.2* 7.1*  HCT 27.0*  < > 26.0* 25.6* 26.1* 25.6* 21.7*  MCV 97.8  --   --  98.8  --  100.0 98.6  PLT 245  --   --  267  --  290 269  < > = values in this interval not displayed. Basic Metabolic Panel:  Recent Labs Lab  11/17/16 1002 11/18/16 0430 11/19/16 0522  NA 134* 133* 134*  K 4.5 4.2 4.6  CL 102 107 107  CO2 21* 18* 19*  GLUCOSE 94 87 127*  BUN 47* 38* 35*  CREATININE 2.84* 2.42* 2.45*  CALCIUM 8.6* 8.0* 8.2*   GFR: Estimated Creatinine Clearance: 17.8 mL/min (by C-G formula based on SCr of 2.45 mg/dL (H)). Liver Function Tests:  Recent Labs Lab 11/17/16 1002 11/18/16 0430  AST 15 16  ALT 8* 9*  ALKPHOS 38 36*  BILITOT 0.4 0.6  PROT 5.4* 5.1*  ALBUMIN 2.4* 2.2*   No results for input(s): LIPASE, AMYLASE in the last 168 hours. No results for input(s): AMMONIA in the last 168 hours. Coagulation Profile:  Recent Labs Lab 11/17/16 1002  INR 1.13   Cardiac Enzymes: No results for input(s): CKTOTAL, CKMB, CKMBINDEX, TROPONINI in the last 168 hours. BNP (last 3 results) No results for input(s): PROBNP in the last 8760 hours. HbA1C: No results for input(s): HGBA1C in the last 72 hours. CBG:  Recent Labs Lab 11/18/16 1143 11/18/16 1632 11/18/16 2121 11/19/16 0801 11/19/16 1203  GLUCAP 103* 87 105* 134* 111*   Lipid Profile: No results for input(s): CHOL, HDL, LDLCALC, TRIG, CHOLHDL, LDLDIRECT in the last 72 hours. Thyroid Function Tests: No results for input(s): TSH, T4TOTAL, FREET4, T3FREE, THYROIDAB in the last 72 hours. Anemia Panel: No results for input(s): VITAMINB12, FOLATE, FERRITIN, TIBC, IRON, RETICCTPCT in the last 72 hours. Sepsis Labs: No results for input(s): PROCALCITON, LATICACIDVEN in the last 168 hours.  No results found for this or any previous visit (from the past 240 hour(s)).       Radiology Studies: Ct Abdomen Pelvis Wo Contrast  Result Date: 11/18/2016 CLINICAL DATA:  Right-sided abdominal pain. History: Heads in 2017. Previous colonic perforation during colonoscopy. EXAM: CT ABDOMEN AND PELVIS WITHOUT CONTRAST TECHNIQUE: Multidetector CT imaging of the abdomen and pelvis was performed following the standard protocol without IV contrast.  COMPARISON:  None. FINDINGS: Lower chest: Chronic interstitial lung changes. Bibasilar peribronchial airspace consolidation versus atelectasis. Small left pleural effusion. Advanced calcific atherosclerotic disease of the coronary arteries. Calcific atherosclerotic disease of the aorta. Hepatobiliary: No focal liver abnormality is seen. No gallstones, gallbladder wall thickening, or biliary dilatation. Pancreas: Unremarkable. No pancreatic ductal dilatation or surrounding inflammatory changes. Spleen: Normal in size without focal abnormality. Adrenals/Urinary Tract: Adrenal glands demonstrate mild symmetric thickening. No evidence of hydronephrosis. Bilateral nonobstructive nephrolithiasis with the largest left lower pole renal stone measuring 13 mm. Bilateral renal masses, incompletely evaluated due to lack of IV contrast. Stomach/Bowel: Stomach is within normal limits. No evidence of abnormal bowel dilation. Scattered left colonic diverticulosis. Mild circumferential mucosal thickening of the proximal descending colon. Irregular mucosal thickening of  the cecum. No free gas or oral contrast extravasation into the abdomen. Vascular/Lymphatic: Right central mesenteric soft tissue masses likely represent conglomerate of abnormal lymph nodes the larger of the 2 measures 3.3 by 2.4 by 4.9 cm. Best seen on coronal image 40/sequence 5. Other smaller mesenteric lymph nodes, as well as retroperitoneal periaortic and pericaval lymph nodes are also seen. There is mesenteric stranding, predominantly in the right lower quadrant of the abdomen and small amount of free fluid. Calcific atherosclerotic disease of the aorta with mild fusiform dilation of the infrarenal aorta measuring up to 2.8 cm in diameter. Reproductive: Mildly enlarged prostate gland. Other: Fat containing right inguinal hernia.  Mild anasarca. Musculoskeletal: Age-indeterminate compression fracture of L2 vertebral body with approximately 50% height loss. Prior  T9 vertebroplasty. IMPRESSION: Irregular mucosal thickening of the cecum with pericecal mesenteric inflammatory changes and small amount of free fluid in the right lower quadrant of the abdomen. No evidence of contrast extravasation or free intra- abdominal gas. These findings may be due to cancer of the cecum or focal colitis. Given presence of 2 right central mesenteric soft tissue masses, likely representing lymphadenopathy, malignancy is of primary consideration. Mild smooth mucosal thickening of the descending colon, nonspecific. Scattered colonic diverticulosis. 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 Age-indeterminate compression fracture of L2 vertebral body. Bilateral renal masses, incompletely evaluated due to lack of IV contrast. Chronic interstitial lung changes, bibasilar peribronchial airspace disease versus atelectasis. Small left pleural effusion. Electronically Signed   By: Fidela Salisbury M.D.   On: 11/18/2016 14:06        Scheduled Meds: . sodium chloride   Intravenous Once  . sodium chloride   Intravenous Once  . omega-3 acid ethyl esters  1 g Oral Daily  . pantoprazole (PROTONIX) IV  40 mg Intravenous Q12H  . piperacillin-tazobactam (ZOSYN)  IV  2.25 g Intravenous Q8H   Continuous Infusions: . sodium chloride 100 mL/hr at 11/19/16 0049  . sodium chloride       LOS: 2 days    Leroy Catlin Tanna Furry, MD [facility] Pager [phone]  If 7PM-7AM, please contact night-coverage [email or web address] Password [email or web address] 11/19/2016, 1:08 PM

## 2016-11-19 NOTE — Op Note (Signed)
[facility] Patient Name: Leroy Soto Procedure Date : 11/19/2016 MRN: [id number] Attending MD: Juanita Craver , MD Date of Birth: 1919-12-13 CSN: [id number] Age: 83 Admit Type: Inpatient Procedure:                Diagnostic EGD. Indications:              Iron deficiency anemia secondary to chronic blood                            loss, Hematochezia. Providers:                Juanita Craver, MD, Vista Lawman, RN, William Dalton,                            Technician Referring MD:             THP Medicines:                Fentanyl 10 micrograms IV, Midazolam 1 mg IV Complications:            No immediate complications. Estimated Blood Loss:     Estimated blood loss: none. Procedure:                Pre-anesthesia assessment: - Prior to the                            procedure, a history and physical was performed,                            and patient medications and allergies were                            reviewed. The patient's tolerance of previous                            anesthesia was also reviewed. The risks and                            benefits of the procedure and the sedation options                            and risks were discussed with the patient. All                            questions were answered, and informed consent was                            obtained. Prior anticoagulants: The patient has                            taken no previous anticoagulant or antiplatelet                            agents. ASA Grade assessment: III - A patient with  severe systemic disease. After reviewing the risks                            and benefits, the patient was deemed in                            satisfactory condition to undergo the procedure.                            After obtaining informed consent, the endoscope was                            passed under direct vision. Throughout the                            procedure,  the patient's blood pressure, pulse, and                            oxygen saturations were monitored continuously. The                            [id number]) scope was introduced through the                            mouth, and advanced to the second part of duodenum.                            The EGD was accomplished without difficulty. The                            patient tolerated the procedure well. Scope In: Scope Out: Findings:      The examined esophagus and GEJ appeared widely patent and normal.      Patchy moderate inflammation characterized by erythema was found in the       fundus.      A few sessile polyps were found in the body of the stomach.      The cardia and gastric fundus were normal on retroflexion.      The examined duodenum was normal. Impression:               - Normal esophagus.                           - Patchy gastritis in the fundus .                           - A few small sessile gastric polyps in the body.                           - Normal examined duodenum.                           - No specimens collected. Moderate Sedation:      Moderate (conscious) sedation was administered by the endoscopy nurse       and supervised by  the endoscopist. The following parameters were       monitored: oxygen saturation, heart rate, blood pressure, respiratory       rate, EKG, adequacy of pulmonary ventilation, and response to care.       Total physician intraservice time was 7 minutes. Recommendation:           - Full liquid diet today.                           - Continue present medications.                           - Continue supportive care. Procedure Code(s):        --- Professional ---                           [phone], Esophagogastroduodenoscopy, flexible,                            transoral; diagnostic, including collection of                            specimen(s) by brushing or washing, when performed                            (separate  procedure) Diagnosis Code(s):        --- Professional ---                           D50.0, Iron deficiency anemia secondary to blood                            loss (chronic)                           K92.1, Melena (includes Hematochezia)                           K31.7, Polyp of stomach and duodenum                           K29.70, Gastritis, unspecified, without bleeding CPT copyright 2016 American Medical Association. All rights reserved. The codes documented in this report are preliminary and upon coder review may  be revised to meet current compliance requirements. Juanita Craver, MD Juanita Craver, MD 11/19/2016 3:15:08 PM This report has been signed electronically. Number of Addenda: 0

## 2016-11-20 ENCOUNTER — Encounter (HOSPITAL_COMMUNITY): Payer: Self-pay | Admitting: Gastroenterology

## 2016-11-20 LAB — CBC
HCT: 22.3 % — ABNORMAL LOW (ref 39.0–52.0)
Hemoglobin: 7.2 g/dL — ABNORMAL LOW (ref 13.0–17.0)
MCH: 31 pg (ref 26.0–34.0)
MCHC: 32.3 g/dL (ref 30.0–36.0)
MCV: 96.1 fL (ref 78.0–100.0)
PLATELETS: 257 10*3/uL (ref 150–400)
RBC: 2.32 MIL/uL — AB (ref 4.22–5.81)
RDW: 17.1 % — AB (ref 11.5–15.5)
WBC: 23.9 10*3/uL — ABNORMAL HIGH (ref 4.0–10.5)

## 2016-11-20 LAB — PREPARE RBC (CROSSMATCH)

## 2016-11-20 LAB — GLUCOSE, CAPILLARY: Glucose-Capillary: 139 mg/dL — ABNORMAL HIGH (ref 65–99)

## 2016-11-20 LAB — HEMOGLOBIN AND HEMATOCRIT, BLOOD
HEMATOCRIT: 22.6 % — AB (ref 39.0–52.0)
HEMOGLOBIN: 7.3 g/dL — AB (ref 13.0–17.0)

## 2016-11-20 MED ORDER — PANTOPRAZOLE SODIUM 40 MG PO TBEC
40.0000 mg | DELAYED_RELEASE_TABLET | Freq: Every day | ORAL | Status: DC
  Administered 2016-11-20 – 2016-11-23 (×4): 40 mg via ORAL
  Filled 2016-11-20 (×4): qty 1

## 2016-11-20 MED ORDER — SODIUM CHLORIDE 0.9 % IV SOLN: Freq: Once | INTRAVENOUS | Status: DC

## 2016-11-20 NOTE — Progress Notes (Signed)
Pharmacy Antibiotic Note  Leroy Soto is a 83 y.o. male admitted on 11/17/2016 with GI bleed and concern for intra-abdominal infection.  Pharmacy has been consulted for zosyn dosing. Today is D#4 on zosyn. WBC up to 23.9, Afeb, renal function with little improvement. CrCl ~18 not on HD.   Plan: - Continue Zosyn 2.25g Q8H with CrCl <20 not on HD -Monitor renal function, WBC, clinical picture -Follow up LOT  Height: 5\' 10"  (177.8 cm) Weight: 151 lb 3.8 oz (68.6 kg) IBW/kg (Calculated) : 73  Temp (24hrs), Avg:98 F (36.7 C), Min:97.5 F (36.4 C), Max:99 F (37.2 C)   Recent Labs Lab 11/17/16 1002 11/18/16 0430 11/18/16 1816 11/19/16 0522 11/20/16 1000  WBC 16.0* 17.7* 21.9* 18.7* 23.9*  CREATININE 2.84* 2.42*  --  2.45*  --     Estimated Creatinine Clearance: 17.9 mL/min (by C-G formula based on SCr of 2.45 mg/dL (H)).    No Known Allergies  Antimicrobials this admission: Zosyn 1/27 >>  Dose adjustments this admission: none  Microbiology results: none  Thank you for allowing pharmacy to be a part of this patient's care.  Carlean Jews, Pharm.D. PGY1 Pharmacy Resident 1/30/20182:22 PM Pager [phone]

## 2016-11-20 NOTE — Progress Notes (Signed)
PROGRESS NOTE    Leroy Soto  [id number] DOB: 1920-05-07 DOA: 11/17/2016 PCP: Default, Provider, MD   Brief Narrative: 83 y.o. male with a Past Medical History of  hypertension, history of colon cancer, status post radiation treatment last year, with findings as possible colitis s/p treatment with ciprofloxacin and Flagyl by his primary care doctor, now presents from [facility] ED with complaints of bloody diarrhea, anemia.   Assessment & Plan:  #  GI bleed, likely lower:  -Evaluated by gastroenterologist,s/p Upper GI endoscopy on 1/29 with polyp and no active bleed. Pt likely has lower GI Bleeding. Reported bloody bowel movement this am. Follow up Gi's for further evaluation..  -history of colon cancer in February 2017, status post palliative radiation, not a surgical candidate. -Currently nothing by mouth, IV fluid. Supportive care. -will change to oral protonix.  # Presumed colitis: on Iv zosyn. Monitor.  #Acute blood loss anemia: Hemoglobin dropping to 7.3 today. Patient received a unit of blood yesterday. Continue to have GI bleed. Hemoglobin did not respond well with the transfusion. Plan to transfuse another unit of blood today. Discussed with the patient's son and daughter at bedside.  -Repeat CBC in the morning.  # Leukocytosis: Exact etiology unknown likely related with stress ? Due to colitis. We do not have baseline WBC count for this patient. He has no fever or any sign of infection this time. Repeat CBC in the morning with differentials. Continue to monitor.  #Hypotension likely in the setting of bleeding and dehydration: Discontinue IV fluid since patient has good oral intake. Continue to hold Lasix and metoprolol. Blood pressure improved this morning.  #Acute kidney injury versus Acute on CKD III: Baseline serum creatinine level unknown. On admission patient had serum creatinine level of 2.8 which is trending down to 2.4. Repeat BMP in the morning. Avoid  nephrotoxins.  #History of atrial fibrillation: No anticoagulation because of bleeding. Continue to monitor. Metoprolol on hold because of hypotension.  Principal Problem:   GIB (gastrointestinal bleeding) Active Problems:   Acute blood loss anemia   HTN (hypertension)   CKD (chronic kidney disease)   CAD (coronary artery disease) of artery bypass graft   A-fib (HCC)   Abdominal pain   GI bleed  DVT prophylaxis: SCD. No anticoagulation because of bleeding Code Status: Full code Family Communication: Discussed with the patient's son and daughter at bedside in detail. Disposition Plan: Likely discharge home in 1-2 days.    Consultants:   Gastroenterologist  Procedures: Possible endoscopy today Antimicrobials: None  Subjective: Patient was seen and examined at bedside. Patient reported feeling good. Denied headache, dizziness, nausea, vomiting, chest pressure shortness of breath. Has weakness.  Objective: Vitals:   11/20/16 1148 11/20/16 1208 11/20/16 1223 11/20/16 1522  BP: (!) 115/55  121/75 104/70  Pulse: 100  (!) 103 97  Resp: 17  14 18   Temp: 97.9 F (36.6 C)  97.5 F (36.4 C) 98.5 F (36.9 C)  TempSrc: Oral Oral Oral Oral  SpO2:    97%  Weight:      Height:        Intake/Output Summary (Last 24 hours) at 11/20/16 1547 Last data filed at 11/20/16 1528  Gross per 24 hour  Intake             1878 ml  Output              550 ml  Net             1328 ml  Filed Weights   11/17/16 0901 11/17/16 2040 11/20/16 1145  Weight: 68.8 kg (151 lb 10.8 oz) 68.1 kg (150 lb 2.1 oz) 68.6 kg (151 lb 3.8 oz)    Examination:  General exam: Pleasant elderly male lying in bed, looks comfortable. Respiratory system: Clear bilaterally, respiratory effort normal. Cardiovascular system: Regular rate and rhythm, S1-S2 normal. No pedal edema.. Gastrointestinal system: Abdomen soft, nontender, nondistended. Bowel sound positive.  Central nervous system: Alert awake and following  commands. Extremities: Symmetric 5 x 5 power. Skin: No rashes, lesions or ulcers Psychiatry: Judgement and insight appear normal.     Data Reviewed: I have personally reviewed following labs and imaging studies  CBC:  Recent Labs Lab 11/17/16 1002  11/18/16 0430  11/18/16 1816 11/19/16 0522 11/19/16 1539 11/20/16 0610 11/20/16 1000  WBC 16.0*  --  17.7*  --  21.9* 18.7*  --   --  23.9*  NEUTROABS 13.2*  --   --   --   --   --   --   --   --   HGB 8.8*  --  8.4*  --  8.2* 7.1* 6.9* 7.3* 7.2*  HCT 27.0*  < > 25.6*  < > 25.6* 21.7* 20.9* 22.6* 22.3*  MCV 97.8  --  98.8  --  100.0 98.6  --   --  96.1  PLT 245  --  267  --  290 269  --   --  257  < > = values in this interval not displayed. Basic Metabolic Panel:  Recent Labs Lab 11/17/16 1002 11/18/16 0430 11/19/16 0522  NA 134* 133* 134*  K 4.5 4.2 4.6  CL 102 107 107  CO2 21* 18* 19*  GLUCOSE 94 87 127*  BUN 47* 38* 35*  CREATININE 2.84* 2.42* 2.45*  CALCIUM 8.6* 8.0* 8.2*   GFR: Estimated Creatinine Clearance: 17.9 mL/min (by C-G formula based on SCr of 2.45 mg/dL (H)). Liver Function Tests:  Recent Labs Lab 11/17/16 1002 11/18/16 0430  AST 15 16  ALT 8* 9*  ALKPHOS 38 36*  BILITOT 0.4 0.6  PROT 5.4* 5.1*  ALBUMIN 2.4* 2.2*   No results for input(s): LIPASE, AMYLASE in the last 168 hours. No results for input(s): AMMONIA in the last 168 hours. Coagulation Profile:  Recent Labs Lab 11/17/16 1002  INR 1.13   Cardiac Enzymes: No results for input(s): CKTOTAL, CKMB, CKMBINDEX, TROPONINI in the last 168 hours. BNP (last 3 results) No results for input(s): PROBNP in the last 8760 hours. HbA1C: No results for input(s): HGBA1C in the last 72 hours. CBG:  Recent Labs Lab 11/18/16 2121 11/19/16 0801 11/19/16 1203 11/19/16 1747 11/19/16 2028  GLUCAP 105* 134* 111* 139* 153*   Lipid Profile: No results for input(s): CHOL, HDL, LDLCALC, TRIG, CHOLHDL, LDLDIRECT in the last 72 hours. Thyroid  Function Tests: No results for input(s): TSH, T4TOTAL, FREET4, T3FREE, THYROIDAB in the last 72 hours. Anemia Panel: No results for input(s): VITAMINB12, FOLATE, FERRITIN, TIBC, IRON, RETICCTPCT in the last 72 hours. Sepsis Labs: No results for input(s): PROCALCITON, LATICACIDVEN in the last 168 hours.  No results found for this or any previous visit (from the past 240 hour(s)).       Radiology Studies: No results found.      Scheduled Meds: . sodium chloride   Intravenous Once  . sodium chloride   Intravenous Once  . omega-3 acid ethyl esters  1 g Oral Daily  . pantoprazole (PROTONIX) IV  40 mg Intravenous  Q12H  . piperacillin-tazobactam (ZOSYN)  IV  2.25 g Intravenous Q8H   Continuous Infusions:    LOS: 3 days    Dron Tanna Furry, MD [facility] Pager [phone]  If 7PM-7AM, please contact night-coverage [email or web address] Password [email or web address] 11/20/2016, 3:47 PM

## 2016-11-21 DIAGNOSIS — I480 Paroxysmal atrial fibrillation: Secondary | ICD-10-CM

## 2016-11-21 LAB — RENAL FUNCTION PANEL
ALBUMIN: 2 g/dL — AB (ref 3.5–5.0)
Anion gap: 8 (ref 5–15)
BUN: 35 mg/dL — AB (ref 6–20)
CHLORIDE: 111 mmol/L (ref 101–111)
CO2: 18 mmol/L — ABNORMAL LOW (ref 22–32)
Calcium: 8.5 mg/dL — ABNORMAL LOW (ref 8.9–10.3)
Creatinine, Ser: 2.11 mg/dL — ABNORMAL HIGH (ref 0.61–1.24)
GFR calc Af Amer: 29 mL/min — ABNORMAL LOW (ref >60)
GFR calc non Af Amer: 25 mL/min — ABNORMAL LOW (ref >60)
GLUCOSE: 94 mg/dL (ref 65–99)
PHOSPHORUS: 2.8 mg/dL (ref 2.5–4.6)
POTASSIUM: 4.2 mmol/L (ref 3.5–5.1)
Sodium: 137 mmol/L (ref 135–145)

## 2016-11-21 LAB — TYPE AND SCREEN
ABO/RH(D): A POS
Antibody Screen: NEGATIVE
UNIT DIVISION: 0
Unit division: 0

## 2016-11-21 LAB — CBC WITH DIFFERENTIAL/PLATELET
BASOS ABS: 0 10*3/uL (ref 0.0–0.1)
Basophils Relative: 0 %
Eosinophils Absolute: 1 10*3/uL — ABNORMAL HIGH (ref 0.0–0.7)
Eosinophils Relative: 4 %
HEMATOCRIT: 30.7 % — AB (ref 39.0–52.0)
Hemoglobin: 10 g/dL — ABNORMAL LOW (ref 13.0–17.0)
LYMPHS ABS: 4.1 10*3/uL — AB (ref 0.7–4.0)
Lymphocytes Relative: 16 %
MCH: 31.3 pg (ref 26.0–34.0)
MCHC: 32.6 g/dL (ref 30.0–36.0)
MCV: 96.2 fL (ref 78.0–100.0)
MONO ABS: 1 10*3/uL (ref 0.1–1.0)
MONOS PCT: 4 %
Neutro Abs: 19.7 10*3/uL — ABNORMAL HIGH (ref 1.7–7.7)
Neutrophils Relative %: 76 %
Platelets: 240 10*3/uL (ref 150–400)
RBC: 3.19 MIL/uL — ABNORMAL LOW (ref 4.22–5.81)
RDW: 17.7 % — ABNORMAL HIGH (ref 11.5–15.5)
WBC: 25.8 10*3/uL — ABNORMAL HIGH (ref 4.0–10.5)

## 2016-11-21 LAB — CBC
HEMATOCRIT: 27.5 % — AB (ref 39.0–52.0)
Hemoglobin: 9.1 g/dL — ABNORMAL LOW (ref 13.0–17.0)
MCH: 31.8 pg (ref 26.0–34.0)
MCHC: 33.1 g/dL (ref 30.0–36.0)
MCV: 96.2 fL (ref 78.0–100.0)
Platelets: 281 10*3/uL (ref 150–400)
RBC: 2.86 MIL/uL — ABNORMAL LOW (ref 4.22–5.81)
RDW: 17.9 % — AB (ref 11.5–15.5)
WBC: 22.8 10*3/uL — ABNORMAL HIGH (ref 4.0–10.5)

## 2016-11-21 NOTE — Care Management Note (Signed)
Case Management Note  Patient Details  Name: Leroy Soto MRN: [id number] Date of Birth: 01/16/1920  Subjective/Objective:                 Spoke with patient's son at bedside. They would liketo use [facility] as they have in the past. Referral made. [facility] can accept. Anticipate DC no earlier than tomorrow. CM to fax out clinicals and orders upon DC.    Action/Plan:  Will DC to home with [facility] through [facility]. [phone] Fax [phone] Expected Discharge Date:  11/19/16               Expected Discharge Plan:  [place]  In-House Referral:     Discharge planning Services  CM Consult  Post Acute Care Choice:  Home Health Choice offered to:  Adult Children  DME Arranged:    DME Agency:     HH Arranged:  PT HH Agency:  [facility]  Status of Service:  In process, will continue to follow  If discussed at Long Length of Stay Meetings, dates discussed:    Additional Comments:  Carles Collet, RN 11/21/2016, 1:49 PM

## 2016-11-21 NOTE — Progress Notes (Signed)
PROGRESS NOTE    Leroy Soto  [id number] DOB: Dec 23, 1919 DOA: 11/17/2016 PCP: Default, Provider, MD   Brief Narrative: 83 y.o. male with a Past Medical History of  hypertension, history of colon cancer, status post radiation treatment last year, with findings as possible colitis s/p treatment with ciprofloxacin and Flagyl by his primary care doctor, now presents from [facility] ED with complaints of bloody diarrhea, anemia.   Assessment & Plan:  #  GI bleed, likely lower:  -Evaluated by gastroenterologist,s/p Upper GI endoscopy on 1/29 with polyp and no active bleed. Pt likely has lower GI Bleeding. Reportedly patient has blood in the stool which is gradually improving. Hemoglobin of 10 today unknown if it is true lab result. Repeat CBC. GI consult appreciated. Patient has no nausea vomiting or abdominal pain. Able to tolerate diet well. Blood pressure is improving. I do not think patient has active bleeding this time.  -history of colon cancer in February 2017, status post palliative radiation, not a surgical candidate. -Continue Protonix and supportive care.  # Presumed colitis: on Iv zosyn. Monitor. May be able to switch to oral Augmentin on discharge.  #Acute blood loss anemia: Hemoglobin jumped from 7.2-10 after a unit of blood transfusion. Unknown if it is true level today. Plan to repeat CBC. Reportedly patient is still has blood in the stool which is likely from prior bleeding. He is otherwise asymptomatic. Continue to monitor for now.  # Leukocytosis: Exact etiology unknown likely related with stress ? Due to colitis. I discussed with the patient's son, patient has baseline leukocytosis with WBC somewhere from 13,000-14,000. Continue antibiotics for now. Patient has no fever chills or any other symptoms. We will continue to monitor and recommended outpatient follow-up with PCP and with hematologist.   #Hypotension likely in the setting of bleeding and dehydration: Blood  pressure better now. I will start low-dose metoprolol and continue to hold Lasix.  # Acute on CKD III: Baseline serum creatinine level of 2.1, the lab results reviewed with the patient's son. Today patient's serum creatinine level is around baseline. Avoid nephrotoxins. Recommend outpatient follow-up.  On admission patient had serum creatinine level of 2.8 which is trending down to 2.1.   #History of atrial fibrillation: No anticoagulation because of bleeding. Continue to monitor. Resume low-dose metoprolol. Patient with elevated heart rate this morning. Principal Problem:   GIB (gastrointestinal bleeding) Active Problems:   Acute blood loss anemia   HTN (hypertension)   CKD (chronic kidney disease)   CAD (coronary artery disease) of artery bypass graft   A-fib (HCC)   Abdominal pain   GI bleed  DVT prophylaxis: SCD. No anticoagulation because of bleeding Code Status: Full code Family Communication: Discussed with the patient's son and daughter at bedside in detail. Disposition Plan: Likely discharge home in 1-2 days.    Consultants:   Gastroenterologist  Procedures: Status post endoscopy Antimicrobials: None  Subjective: Patient was seen and examined at bedside. Patient reported feeling good. Had some blood with the bowel movement. Better than before. Denied headache, dizziness, nausea, vomiting, chest pain, shortness of breath, abdominal pain. Patient's son, daughter at bedside.  Objective: Vitals:   11/20/16 1804 11/20/16 2212 11/21/16 0453 11/21/16 1000  BP: 128/64 (!) 163/68 136/82 112/67  Pulse: 91 (!) 103 (!) 106 95  Resp: 18 18 18 18   Temp: 98.7 F (37.1 C) 98.6 F (37 C) 98.6 F (37 C) 98.4 F (36.9 C)  TempSrc: Oral Oral Oral Oral  SpO2: 98% 96% 98% 100%  Weight:  71.8 kg (158 lb 6.4 oz)    Height:  6' (1.829 m)      Intake/Output Summary (Last 24 hours) at 11/21/16 1445 Last data filed at 11/21/16 0831  Gross per 24 hour  Intake              698 ml    Output              800 ml  Net             -102 ml   Filed Weights   11/17/16 2040 11/20/16 1145 11/20/16 2212  Weight: 68.1 kg (150 lb 2.1 oz) 68.6 kg (151 lb 3.8 oz) 71.8 kg (158 lb 6.4 oz)    Examination:  General exam: Pleasant elderly male lying on bed comfortable, not in distress Respiratory system: Clear bilateral, respiratory effort normal. Cardiovascular system: Regular rate and rhythm, S1-S2 normal. No pedal edema.. Gastrointestinal system: Abdomen soft, nontender, nondistended. Bowel sound positive.  Central nervous system: Alert awake and following commands. Extremities: Symmetric 5 x 5 power. Skin: No rashes, lesions or ulcers Psychiatry: Judgement and insight appear normal.     Data Reviewed: I have personally reviewed following labs and imaging studies  CBC:  Recent Labs Lab 11/17/16 1002  11/18/16 0430  11/18/16 1816 11/19/16 0522 11/19/16 1539 11/20/16 0610 11/20/16 1000 11/21/16 0615  WBC 16.0*  --  17.7*  --  21.9* 18.7*  --   --  23.9* 25.8*  NEUTROABS 13.2*  --   --   --   --   --   --   --   --  19.7*  HGB 8.8*  --  8.4*  --  8.2* 7.1* 6.9* 7.3* 7.2* 10.0*  HCT 27.0*  < > 25.6*  < > 25.6* 21.7* 20.9* 22.6* 22.3* 30.7*  MCV 97.8  --  98.8  --  100.0 98.6  --   --  96.1 96.2  PLT 245  --  267  --  290 269  --   --  257 240  < > = values in this interval not displayed. Basic Metabolic Panel:  Recent Labs Lab 11/17/16 1002 11/18/16 0430 11/19/16 0522 11/21/16 0615  NA 134* 133* 134* 137  K 4.5 4.2 4.6 4.2  CL 102 107 107 111  CO2 21* 18* 19* 18*  GLUCOSE 94 87 127* 94  BUN 47* 38* 35* 35*  CREATININE 2.84* 2.42* 2.45* 2.11*  CALCIUM 8.6* 8.0* 8.2* 8.5*  PHOS  --   --   --  2.8   GFR: Estimated Creatinine Clearance: 21.8 mL/min (by C-G formula based on SCr of 2.11 mg/dL (H)). Liver Function Tests:  Recent Labs Lab 11/17/16 1002 11/18/16 0430 11/21/16 0615  AST 15 16  --   ALT 8* 9*  --   ALKPHOS 38 36*  --   BILITOT 0.4 0.6   --   PROT 5.4* 5.1*  --   ALBUMIN 2.4* 2.2* 2.0*   No results for input(s): LIPASE, AMYLASE in the last 168 hours. No results for input(s): AMMONIA in the last 168 hours. Coagulation Profile:  Recent Labs Lab 11/17/16 1002  INR 1.13   Cardiac Enzymes: No results for input(s): CKTOTAL, CKMB, CKMBINDEX, TROPONINI in the last 168 hours. BNP (last 3 results) No results for input(s): PROBNP in the last 8760 hours. HbA1C: No results for input(s): HGBA1C in the last 72 hours. CBG:  Recent Labs Lab 11/19/16 0801 11/19/16 1203 11/19/16 1747 11/19/16 2028  11/20/16 1717  GLUCAP 134* 111* 139* 153* 139*   Lipid Profile: No results for input(s): CHOL, HDL, LDLCALC, TRIG, CHOLHDL, LDLDIRECT in the last 72 hours. Thyroid Function Tests: No results for input(s): TSH, T4TOTAL, FREET4, T3FREE, THYROIDAB in the last 72 hours. Anemia Panel: No results for input(s): VITAMINB12, FOLATE, FERRITIN, TIBC, IRON, RETICCTPCT in the last 72 hours. Sepsis Labs: No results for input(s): PROCALCITON, LATICACIDVEN in the last 168 hours.  No results found for this or any previous visit (from the past 240 hour(s)).       Radiology Studies: No results found.      Scheduled Meds: . sodium chloride   Intravenous Once  . sodium chloride   Intravenous Once  . omega-3 acid ethyl esters  1 g Oral Daily  . pantoprazole  40 mg Oral Daily  . piperacillin-tazobactam (ZOSYN)  IV  2.25 g Intravenous Q8H   Continuous Infusions:    LOS: 4 days    Dron Tanna Furry, MD [facility] Pager [phone]  If 7PM-7AM, please contact night-coverage [email or web address] Password [email or web address] 11/21/2016, 2:45 PM

## 2016-11-21 NOTE — Progress Notes (Signed)
Physical Therapy Treatment Patient Details Name: Leroy Soto MRN: [id number] DOB: Mar 04, 1920 Today's Date: 11/21/2016    History of Present Illness pt is 83 y/o male with pmh of HTN, colon CA, s/p rdx, colitis 10 days PTA, presenting from outside hospital due to acute GIB/bloody diarrhea.    PT Comments    Progressing steadily each session.  EHR staying lower, pt not as fatigued, but tends to push himself too far.   Follow Up Recommendations  Home health PT;Supervision for mobility/OOB     Equipment Recommendations  None recommended by PT    Recommendations for Other Services       Precautions / Restrictions Precautions Precautions: Fall Restrictions Weight Bearing Restrictions: No    Mobility  Bed Mobility Overal bed mobility: Needs Assistance Bed Mobility: Supine to Sit;Sit to Supine     Supine to sit: Supervision     General bed mobility comments: slower, but no assist  Transfers Overall transfer level: Needs assistance Equipment used: Rolling walker (2 wheeled) Transfers: Sit to/from Stand Sit to Stand: Min guard         General transfer comment: cues for hand placement  Ambulation/Gait Ambulation/Gait assistance: Min guard;Min assist Ambulation Distance (Feet): 140 Feet (130 feet then additional 130 feet) Assistive device: Rolling walker (2 wheeled) Gait Pattern/deviations: Step-through pattern Gait velocity: pt can go faster than he can handle Gait velocity interpretation: at or above normal speed for age/gender General Gait Details: less fatigue today, but then pt wants to go further and got tired   Stairs            Wheelchair Mobility    Modified Rankin (Stroke Patients Only)       Balance Overall balance assessment: Needs assistance   Sitting balance-Leahy Scale: Good       Standing balance-Leahy Scale: Poor Standing balance comment: reliant on the RW                    Cognition Arousal/Alertness:  Awake/alert Behavior During Therapy: WFL for tasks assessed/performed Overall Cognitive Status: Within Functional Limits for tasks assessed                      Exercises      General Comments General comments (skin integrity, edema, etc.): sats 94-95% at 105 bpm      Pertinent Vitals/Pain Pain Assessment: No/denies pain    Home Living                      Prior Function            PT Goals (current goals can now be found in the care plan section) Acute Rehab PT Goals Patient Stated Goal: Get home....get stronger and give [place] some information PT Goal Formulation: With patient/family Time For Goal Achievement: 12/01/16 Potential to Achieve Goals: Good Progress towards PT goals: Progressing toward goals    Frequency    Min 3X/week      PT Plan Current plan remains appropriate    Co-evaluation             End of Session   Activity Tolerance: Patient tolerated treatment well Patient left: in bed;with call bell/phone within reach;with family/visitor present     Time: 1205-1230 PT Time Calculation (min) (ACUTE ONLY): 25 min  Charges:  $Gait Training: 8-22 mins $Therapeutic Activity: 8-22 mins  G CodesTessie Fass Placido Hangartner 11/21/2016, 4:26 PM 11/21/2016  Donnella Sham, [place] [phone]  (pager)

## 2016-11-21 NOTE — Care Management Important Message (Signed)
Important Message  Patient Details  Name: Leroy Soto MRN: [id number] Date of Birth: Oct 24, 1919   Medicare Important Message Given:  Yes    Tyan Lasure Montine Circle 11/21/2016, 12:43 PM

## 2016-11-22 LAB — CBC
HEMATOCRIT: 26.8 % — AB (ref 39.0–52.0)
HEMOGLOBIN: 8.7 g/dL — AB (ref 13.0–17.0)
MCH: 31.3 pg (ref 26.0–34.0)
MCHC: 32.5 g/dL (ref 30.0–36.0)
MCV: 96.4 fL (ref 78.0–100.0)
Platelets: 256 10*3/uL (ref 150–400)
RBC: 2.78 MIL/uL — AB (ref 4.22–5.81)
RDW: 17.8 % — AB (ref 11.5–15.5)
WBC: 18.4 10*3/uL — AB (ref 4.0–10.5)

## 2016-11-22 MED ORDER — METOPROLOL TARTRATE 25 MG PO TABS
25.0000 mg | ORAL_TABLET | Freq: Two times a day (BID) | ORAL | Status: DC
  Administered 2016-11-22 – 2016-11-23 (×3): 25 mg via ORAL
  Filled 2016-11-22 (×3): qty 1

## 2016-11-22 NOTE — Progress Notes (Signed)
At the request of the family I had a long discussion about the plan forward with the patient.  I spoke to his son in the presence of his father.  The EGD was normal and it is clear that he is bleeding from the colon cancer.  This is a progressive issue and it will only become more difficult to manage with time, however, his son is very reasonable and understanding.  Unfortunately there is nothing that I can offer to improve the situation.  He will return back to [facility] and follow up with his oncologist.

## 2016-11-22 NOTE — Progress Notes (Signed)
PROGRESS NOTE    Leroy Soto  [id number] DOB: 07/08/1920 DOA: 11/17/2016 PCP: Default, Provider, MD   Brief Narrative: 83 y.o. male with a Past Medical History of  hypertension, history of colon cancer, status post radiation treatment last year, with findings as possible colitis s/p treatment with ciprofloxacin and Flagyl by his primary care doctor, now presents from [facility] ED with complaints of bloody diarrhea, anemia.   Assessment & Plan:  #  GI bleed, likely lower:  -Evaluated by gastroenterologist,s/p Upper GI endoscopy on 1/29 with polyp. Pt likely has lower GI Bleeding.  -Patient is still having bowel movements mixed with blood cell of them are Pilar Plate read as per patient nurse and patient's son. Also mixed with black colored stool. Unknown if patient has active bleeding vs old blood. -Hemoglobin is slowly trending down. Patient received total of 2 units of red blood cell. He is otherwise clinically stable. Tolerating diet well and has no nausea vomiting or abdominal pain. I discussed with Dr. Benson Norway from gastroenterologist to evaluate the patient. We will follow-up GIs recommendation. May need repeat CBC. -history of colon cancer in February 2017, status post palliative radiation, not a surgical candidate. -Continue Protonix and supportive care.  # Presumed colitis: on Iv zosyn. Monitor. May be able to switch to oral Augmentin on discharge.  #Acute blood loss anemia: Received total of 2 units of red blood cell transfusion. Monitor CBC. Hemoglobin again trending down slowly. Follow Gi.  # Leukocytosis: Exact etiology unknown likely related with stress ? Due to colitis. I discussed with the patient's son, patient has baseline leukocytosis with WBC somewhere from 13,000-14,000. Continue antibiotics for now. Patient has no fever chills or any other symptoms. We will continue to monitor and recommended outpatient follow-up with PCP and with hematologist.   #Hypotension likely in  the setting of bleeding and dehydration: Blood pressure better now. I resumed low-dose metoprolol and continue to hold Lasix.  # Acute on CKD III: Baseline serum creatinine level of 2.1, the lab results reviewed with the patient's son. Today patient's serum creatinine level is around baseline. Avoid nephrotoxins. Recommend outpatient follow-up.  On admission patient had serum creatinine level of 2.8 which is trending down to 2.1.   #History of atrial fibrillation: No anticoagulation because of bleeding. Continue to monitor. Resume low-dose metoprolol. Patient with elevated heart rate this morning. Principal Problem:   GIB (gastrointestinal bleeding) Active Problems:   Acute blood loss anemia   HTN (hypertension)   CKD (chronic kidney disease)   CAD (coronary artery disease) of artery bypass graft   A-fib (HCC)   Abdominal pain   GI bleed  DVT prophylaxis: SCD. No anticoagulation because of bleeding Code Status: Full code Family Communication: Discussed with the patient's son in detail at bedside. Disposition Plan: Likely discharge home in 1-2 days.    Consultants:   Gastroenterologist  Procedures: Status post endoscopy Antimicrobials: None  Subjective: Patient was seen and examined at bedside. Reported doing well. Had 2 units at a bowel movement today mixed with blood. Patient's son at bedside. He denied headache, dizziness, nausea, vomiting, chest pain or shortness of breath. Objective: Vitals:   11/21/16 1747 11/21/16 2017 11/22/16 0545 11/22/16 1114  BP: 105/68 (!) 144/64 118/72 102/65  Pulse: (!) 102 99 (!) 112 79  Resp: 18 19 20 18   Temp: 98.4 F (36.9 C) 98.1 F (36.7 C) 98.4 F (36.9 C) 97.6 F (36.4 C)  TempSrc: Oral Oral Oral Oral  SpO2: 97% 98% 99% 95%  Weight:  70.6 kg (155 lb 10.3 oz)    Height:        Intake/Output Summary (Last 24 hours) at 11/22/16 1425 Last data filed at 11/22/16 1343  Gross per 24 hour  Intake             1110 ml  Output              1725 ml  Net             -615 ml   Filed Weights   11/20/16 1145 11/20/16 2212 11/21/16 2017  Weight: 68.6 kg (151 lb 3.8 oz) 71.8 kg (158 lb 6.4 oz) 70.6 kg (155 lb 10.3 oz)    Examination:  General exam: Pleasant elderly male lying in bed comfortable, not in distress Respiratory system: Clear bilaterally, no wheezing or crackle Cardiovascular system: Regular rate rhythm, S1-S2 normal. No pedal edema.. Gastrointestinal system: Abdomen soft, nontender, nondistended. Bowel son positive. Central nervous system: Alert awake and following commands. Extremities: Symmetric 5 x 5 power. Skin: No rashes, lesions or ulcers Psychiatry: Judgement and insight appear normal.     Data Reviewed: I have personally reviewed following labs and imaging studies  CBC:  Recent Labs Lab 11/17/16 1002  11/19/16 0522  11/20/16 0610 11/20/16 1000 11/21/16 0615 11/21/16 1711 11/22/16 0540  WBC 16.0*  < > 18.7*  --   --  23.9* 25.8* 22.8* 18.4*  NEUTROABS 13.2*  --   --   --   --   --  19.7*  --   --   HGB 8.8*  < > 7.1*  < > 7.3* 7.2* 10.0* 9.1* 8.7*  HCT 27.0*  < > 21.7*  < > 22.6* 22.3* 30.7* 27.5* 26.8*  MCV 97.8  < > 98.6  --   --  96.1 96.2 96.2 96.4  PLT 245  < > 269  --   --  257 240 281 256  < > = values in this interval not displayed. Basic Metabolic Panel:  Recent Labs Lab 11/17/16 1002 11/18/16 0430 11/19/16 0522 11/21/16 0615  NA 134* 133* 134* 137  K 4.5 4.2 4.6 4.2  CL 102 107 107 111  CO2 21* 18* 19* 18*  GLUCOSE 94 87 127* 94  BUN 47* 38* 35* 35*  CREATININE 2.84* 2.42* 2.45* 2.11*  CALCIUM 8.6* 8.0* 8.2* 8.5*  PHOS  --   --   --  2.8   GFR: Estimated Creatinine Clearance: 21.4 mL/min (by C-G formula based on SCr of 2.11 mg/dL (H)). Liver Function Tests:  Recent Labs Lab 11/17/16 1002 11/18/16 0430 11/21/16 0615  AST 15 16  --   ALT 8* 9*  --   ALKPHOS 38 36*  --   BILITOT 0.4 0.6  --   PROT 5.4* 5.1*  --   ALBUMIN 2.4* 2.2* 2.0*   No results for  input(s): LIPASE, AMYLASE in the last 168 hours. No results for input(s): AMMONIA in the last 168 hours. Coagulation Profile:  Recent Labs Lab 11/17/16 1002  INR 1.13   Cardiac Enzymes: No results for input(s): CKTOTAL, CKMB, CKMBINDEX, TROPONINI in the last 168 hours. BNP (last 3 results) No results for input(s): PROBNP in the last 8760 hours. HbA1C: No results for input(s): HGBA1C in the last 72 hours. CBG:  Recent Labs Lab 11/19/16 0801 11/19/16 1203 11/19/16 1747 11/19/16 2028 11/20/16 1717  GLUCAP 134* 111* 139* 153* 139*   Lipid Profile: No results for input(s): CHOL, HDL, LDLCALC, TRIG, CHOLHDL,  LDLDIRECT in the last 72 hours. Thyroid Function Tests: No results for input(s): TSH, T4TOTAL, FREET4, T3FREE, THYROIDAB in the last 72 hours. Anemia Panel: No results for input(s): VITAMINB12, FOLATE, FERRITIN, TIBC, IRON, RETICCTPCT in the last 72 hours. Sepsis Labs: No results for input(s): PROCALCITON, LATICACIDVEN in the last 168 hours.  No results found for this or any previous visit (from the past 240 hour(s)).       Radiology Studies: No results found.      Scheduled Meds: . sodium chloride   Intravenous Once  . sodium chloride   Intravenous Once  . metoprolol tartrate  25 mg Oral BID  . omega-3 acid ethyl esters  1 g Oral Daily  . pantoprazole  40 mg Oral Daily  . piperacillin-tazobactam (ZOSYN)  IV  2.25 g Intravenous Q8H   Continuous Infusions:    LOS: 5 days    Mansel Strother Tanna Furry, MD [facility] Pager [phone]  If 7PM-7AM, please contact night-coverage [email or web address] Password [email or web address] 11/22/2016, 2:25 PM

## 2016-11-23 DIAGNOSIS — I10 Essential (primary) hypertension: Secondary | ICD-10-CM

## 2016-11-23 LAB — CBC
HCT: 26.5 % — ABNORMAL LOW (ref 39.0–52.0)
Hemoglobin: 8.5 g/dL — ABNORMAL LOW (ref 13.0–17.0)
MCH: 31.5 pg (ref 26.0–34.0)
MCHC: 32.1 g/dL (ref 30.0–36.0)
MCV: 98.1 fL (ref 78.0–100.0)
Platelets: 258 10*3/uL (ref 150–400)
RBC: 2.7 MIL/uL — ABNORMAL LOW (ref 4.22–5.81)
RDW: 17.8 % — ABNORMAL HIGH (ref 11.5–15.5)
WBC: 16.2 10*3/uL — ABNORMAL HIGH (ref 4.0–10.5)

## 2016-11-23 MED ORDER — METOPROLOL TARTRATE 50 MG PO TABS: 25.0000 mg | ORAL_TABLET | Freq: Two times a day (BID) | ORAL | Status: AC

## 2016-11-23 MED ORDER — AMOXICILLIN-POT CLAVULANATE 500-125 MG PO TABS
1.0000 | ORAL_TABLET | Freq: Two times a day (BID) | ORAL | Status: DC
  Administered 2016-11-23: 500 mg via ORAL
  Filled 2016-11-23: qty 1

## 2016-11-23 MED ORDER — AMOXICILLIN-POT CLAVULANATE 500-125 MG PO TABS: 1.0000 | ORAL_TABLET | Freq: Two times a day (BID) | ORAL | 0 refills | Status: AC

## 2016-11-23 NOTE — Discharge Summary (Signed)
Physician Discharge Summary  Leroy Soto [id number] DOB: 07-03-1920 DOA: 11/17/2016  PCP: Default, Provider, MD  Admit date: 11/17/2016 Discharge date: 11/23/2016  Admitted From:home Disposition:home with home care  Recommendations for Outpatient Follow-up:  1. Follow up with PCP in 1-2 weeks 2. Please obtain BMP/CBC in one week 3. Please follow-up with oncologist in 1-2 weeks.   Home Health: Yes Equipment/Devices: No Discharge Condition: Stable CODE STATUS: Full code Diet recommendation: Heart healthy  Brief/Interim Summary:83 y.o.malewith a Past Medical History of hypertension, history of colon cancer, status post radiation treatment last year, with findings as possible colitis s/p treatment with ciprofloxacin and Flagyl by his primary care doctor, now presents from [facility] ED with complaints of bloody diarrhea, anemia.  #  GI bleed, likely lower:  -Evaluated by gastroenterologist,s/p Upper GI endoscopy on 1/29 with polyp. The bleeding has stopped. Patient now has only minimal black colored stool. Evaluated by GI, Dr. Benson Norway recommended to follow-up with oncologist because the bleeding is most likely from colon cancer. I discussed this with the patient and his son at bedside in detail. They verbalized understanding. Aspirin on hold on discharge. -history of colon cancer in February 2017, status post palliative radiation, not a surgical candidate. -Continue Protonix and supportive care.  # Presumed colitis: Received IV Zosyn in the hospital. Plan to discharge with oral Augmentin for 3 more days. I discussed the patient's son regarding probiotics or yogurt while on antibiotics. They verbalized understanding. Patient has no nausea vomiting or abdominal pain. He is able to tolerate diet very well.  #Acute blood loss anemia: Received total of 2 units of red blood cell transfusion. Hemoglobin around baseline. Recommended outpatient follow-up and repeat CBC in a week.  #  Leukocytosis: Exact etiology unknown likely related with stress ? Due to colitis. I discussed with the patient's son, patient has baseline leukocytosis with WBC somewhere from 13,000-14,000. Continue antibiotics for now. Patient has no fever chills or any other symptoms. WBC count is trending down today. I recommended outpatient follow-up with PCP and with hematologist.   #Hypotension likely in the setting of bleeding and dehydration: Blood pressure better now. Resumed lower dose of metoprolol on discharge. Discontinue diuretics.  # Acute on CKD III: Baseline serum creatinine level of 2.1, the lab results reviewed with the patient's son. Serum creatinine level around baseline. On admission patient had serum creatinine level of 2.8 which is trending down to 2.1.   #History of atrial fibrillation: No anticoagulation because of bleeding. On metoprolol for rate control. Recommended outpatient follow-up.  I have been discussing with the patient's family members everyday. Patient is clinically very stable. He denies headache, dizziness, nausea vomiting chest pain or abdominal pain. Able to tolerate diet. Only has minimal black colored stool otherwise no frank blood noticed today.  Discharge Diagnoses:  Principal Problem:   GIB (gastrointestinal bleeding) Active Problems:   Acute blood loss anemia   HTN (hypertension)   CKD (chronic kidney disease)   CAD (coronary artery disease) of artery bypass graft   A-fib (HCC)   Abdominal pain   GI bleed    Discharge Instructions  Discharge Instructions    Call MD for:  difficulty breathing, headache or visual disturbances    Complete by:  As directed    Call MD for:  extreme fatigue    Complete by:  As directed    Call MD for:  hives    Complete by:  As directed    Call MD for:  persistant dizziness or  light-headedness    Complete by:  As directed    Call MD for:  persistant nausea and vomiting    Complete by:  As directed    Call MD for:   severe uncontrolled pain    Complete by:  As directed    Call MD for:  temperature >100.4    Complete by:  As directed    Diet - low sodium heart healthy    Complete by:  As directed    Discharge instructions    Complete by:  As directed    Please follow up with your PCP, oncologist in 1-2 weeks. Please check CBC in one week.   Increase activity slowly    Complete by:  As directed      Allergies as of 11/23/2016   No Known Allergies     Medication List    STOP taking these medications   aspirin EC 325 MG tablet   ciprofloxacin 500 MG tablet Commonly known as:  CIPRO   furosemide 20 MG tablet Commonly known as:  LASIX   metroNIDAZOLE 500 MG tablet Commonly known as:  FLAGYL     TAKE these medications   acetaminophen 500 MG tablet Commonly known as:  TYLENOL Take 500 mg by mouth at bedtime as needed (shoulder pain).   amoxicillin-clavulanate 500-125 MG tablet Commonly known as:  AUGMENTIN Take 1 tablet (500 mg total) by mouth every 12 (twelve) hours.   CALCIUM 600 PO Take 600 mg by mouth daily.   ferrous sulfate 325 (65 FE) MG tablet Take 325 mg by mouth daily with breakfast.   Fish Oil 1000 MG Caps Take 1,000 mg by mouth daily.   fluticasone 50 MCG/ACT nasal spray Commonly known as:  FLONASE Place 1 spray into both nostrils daily as needed (seasonal allergies).   GLUCOSAMINE-CHONDROITIN PO Take 1 tablet by mouth daily.   ICAPS LUTEIN-ZEAXANTHIN PO Take 1 capsule by mouth daily.   metoprolol 50 MG tablet Commonly known as:  LOPRESSOR Take 0.5 tablets (25 mg total) by mouth 2 (two) times daily with a meal. What changed:  how much to take   multivitamin with minerals Tabs tablet Take 1 tablet by mouth daily. Centrum Silver   nitroGLYCERIN 0.4 MG SL tablet Commonly known as:  NITROSTAT Place 0.4 mg under the tongue every 5 (five) minutes as needed for chest pain.   pantoprazole 40 MG tablet Commonly known as:  PROTONIX Take 40 mg by mouth daily.    vitamin C 1000 MG tablet Take 1,000 mg by mouth daily.   vitamin E 400 UNIT capsule Take 400 Units by mouth daily.       No Known Allergies  Consultations: GI  Procedures/Studies: Upper GI endoscopy  Subjective: Patient was seen and examined at bedside. Patient reported feeling much better and ready to go home today. Denied fever, chills, headache, dizziness, nausea, vomiting, chest pain, shortness of breath. No abdominal pain. Able to tolerate diet well.   Discharge Exam: Vitals:   11/23/16 0501 11/23/16 1001  BP: 123/67 130/86  Pulse: 65 92  Resp: 20 18  Temp: 98.1 F (36.7 C) 98.3 F (36.8 C)   Vitals:   11/22/16 1721 11/22/16 2213 11/23/16 0501 11/23/16 1001  BP: 108/66 (!) 142/85 123/67 130/86  Pulse: 82 96 65 92  Resp: 18 19 20 18   Temp: 97.8 F (36.6 C) 97.8 F (36.6 C) 98.1 F (36.7 C) 98.3 F (36.8 C)  TempSrc: Oral Oral Oral Oral  SpO2: 96% 96% 97%  98%  Weight:  70.6 kg (155 lb 9.6 oz)    Height:        General:Pleasant elderly male lying on bed comfortable, , not in acute distress Cardiovascular: RRR, S1/S2 +, no rubs, no gallops Respiratory: CTA bilaterally, no wheezing, no rhonchi Abdominal: Soft, NT, ND, bowel sounds + Extremities: no edema, no cyanosis Neurology: Alert, awake, nonfocal neurological exam.   The results of significant diagnostics from this hospitalization (including imaging, microbiology, ancillary and laboratory) are listed below for reference.     Microbiology: No results found for this or any previous visit (from the past 240 hour(s)).   Labs: BNP (last 3 results) No results for input(s): BNP in the last 8760 hours. Basic Metabolic Panel:  Recent Labs Lab 11/17/16 1002 11/18/16 0430 11/19/16 0522 11/21/16 0615  NA 134* 133* 134* 137  K 4.5 4.2 4.6 4.2  CL 102 107 107 111  CO2 21* 18* 19* 18*  GLUCOSE 94 87 127* 94  BUN 47* 38* 35* 35*  CREATININE 2.84* 2.42* 2.45* 2.11*  CALCIUM 8.6* 8.0* 8.2* 8.5*  PHOS   --   --   --  2.8   Liver Function Tests:  Recent Labs Lab 11/17/16 1002 11/18/16 0430 11/21/16 0615  AST 15 16  --   ALT 8* 9*  --   ALKPHOS 38 36*  --   BILITOT 0.4 0.6  --   PROT 5.4* 5.1*  --   ALBUMIN 2.4* 2.2* 2.0*   No results for input(s): LIPASE, AMYLASE in the last 168 hours. No results for input(s): AMMONIA in the last 168 hours. CBC:  Recent Labs Lab 11/17/16 1002  11/20/16 1000 11/21/16 0615 11/21/16 1711 11/22/16 0540 11/23/16 0705  WBC 16.0*  < > 23.9* 25.8* 22.8* 18.4* 16.2*  NEUTROABS 13.2*  --   --  19.7*  --   --   --   HGB 8.8*  < > 7.2* 10.0* 9.1* 8.7* 8.5*  HCT 27.0*  < > 22.3* 30.7* 27.5* 26.8* 26.5*  MCV 97.8  < > 96.1 96.2 96.2 96.4 98.1  PLT 245  < > 257 240 281 256 258  < > = values in this interval not displayed. Cardiac Enzymes: No results for input(s): CKTOTAL, CKMB, CKMBINDEX, TROPONINI in the last 168 hours. BNP: Invalid input(s): POCBNP CBG:  Recent Labs Lab 11/19/16 0801 11/19/16 1203 11/19/16 1747 11/19/16 2028 11/20/16 1717  GLUCAP 134* 111* 139* 153* 139*   D-Dimer No results for input(s): DDIMER in the last 72 hours. Hgb A1c No results for input(s): HGBA1C in the last 72 hours. Lipid Profile No results for input(s): CHOL, HDL, LDLCALC, TRIG, CHOLHDL, LDLDIRECT in the last 72 hours. Thyroid function studies No results for input(s): TSH, T4TOTAL, T3FREE, THYROIDAB in the last 72 hours.  Invalid input(s): FREET3 Anemia work up No results for input(s): VITAMINB12, FOLATE, FERRITIN, TIBC, IRON, RETICCTPCT in the last 72 hours. Urinalysis No results found for: COLORURINE, APPEARANCEUR, LABSPEC, [place], GLUCOSEU, HGBUR, BILIRUBINUR, KETONESUR, PROTEINUR, UROBILINOGEN, NITRITE, LEUKOCYTESUR Sepsis Labs Invalid input(s): PROCALCITONIN,  WBC,  LACTICIDVEN Microbiology No results found for this or any previous visit (from the past 240 hour(s)).   Time coordinating discharge: 28 minutes  SIGNED:   Rosita Fire, MD  [facility] 11/23/2016, 11:05 AM  If 7PM-7AM, please contact night-coverage [email or web address] Password [email or web address]

## 2016-11-23 NOTE — Progress Notes (Signed)
Faxed clinicals and orders to [facility].

## 2016-11-23 NOTE — Progress Notes (Signed)
Disposition: home with HH. Discharge instructions and summary discussed with pt and son, prescriptions and medication reviewed, verbalized understanding. Iv: discontinued, cath intact. Site clean and dry. Pt was escorted out of the unit in wheelchair accompanied by son, took all of his belongings including glasses and hearing aid.

## 2017-01-20 DEATH — deceased

## 2018-06-02 IMAGING — CT CT ABD-PELV W/O CM
2 of 4 series · 15 of 46 positions shown, 17 images · non-contrast
Comparison: None.

CLINICAL DATA: Right-sided abdominal pain. History: Heads in 2332.
Previous colonic perforation during colonoscopy.

EXAM:
CT ABDOMEN AND PELVIS WITHOUT CONTRAST
TECHNIQUE: Multidetector CT imaging of the abdomen and pelvis was performed
following the standard protocol without IV contrast.

[Series 2: abd/ pelvis 5.0 i30f 1 · axial · 0.73mm/px · z∈[-1104,-664]mm · 12 of 98 slices shown, 14 images]
[im 5/98  soft-tissue]
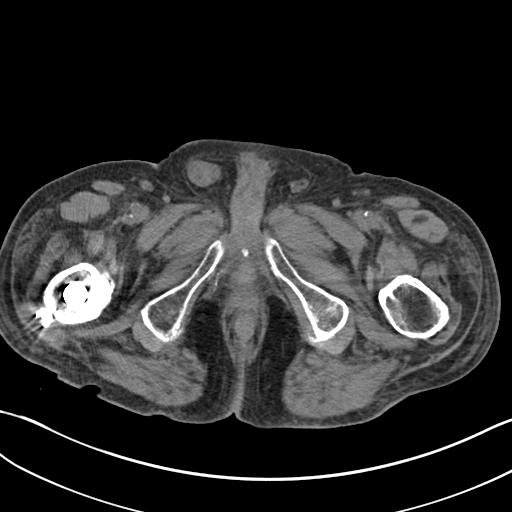
[im 5/98  bone]
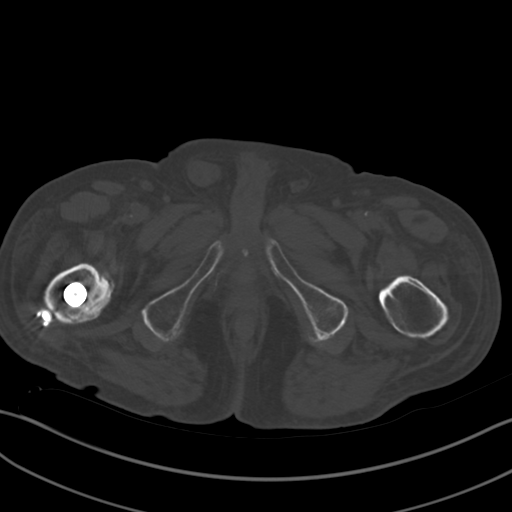
[im 13/98  soft-tissue]
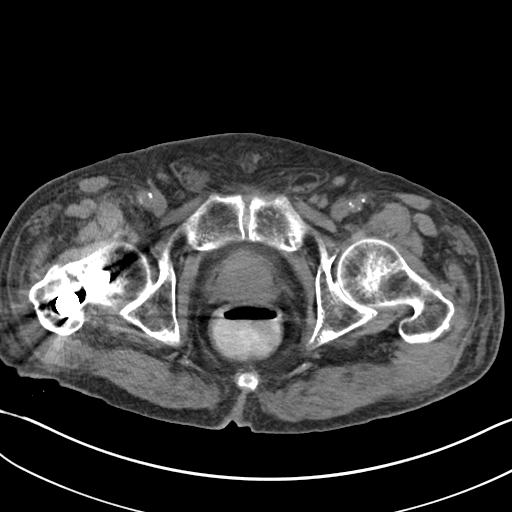
[im 22/98  soft-tissue]
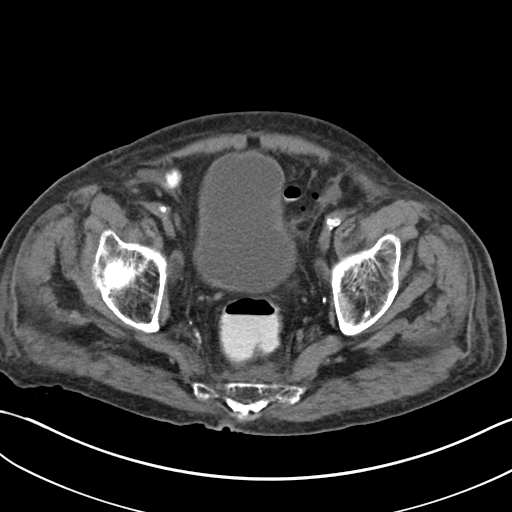
[im 30/98  soft-tissue]
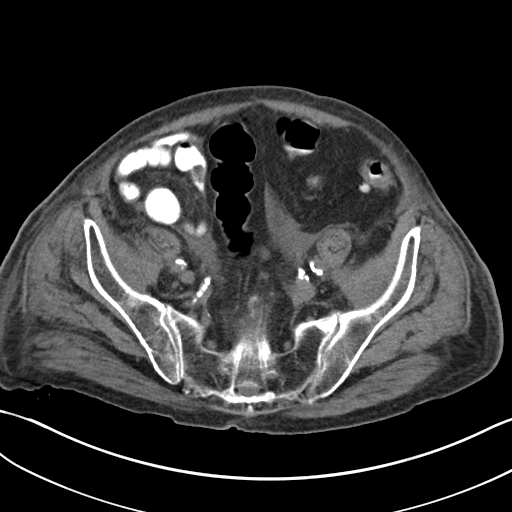
[im 38/98  soft-tissue]
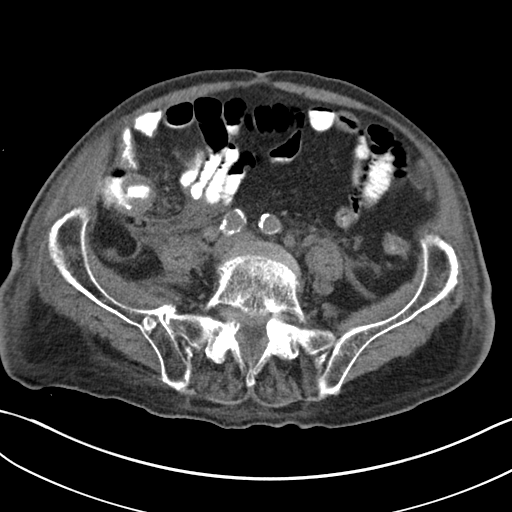
[im 47/98  soft-tissue]
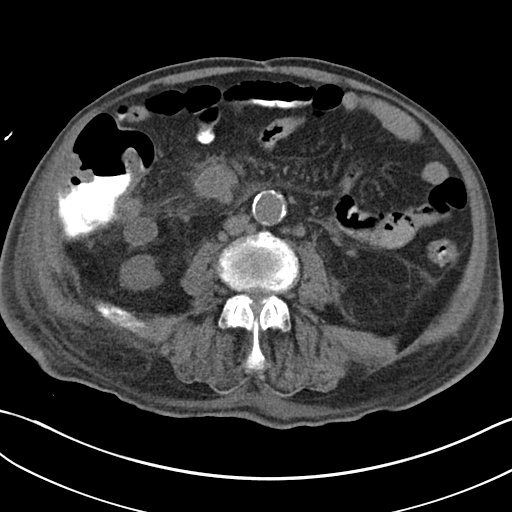
[im 51/98  soft-tissue]
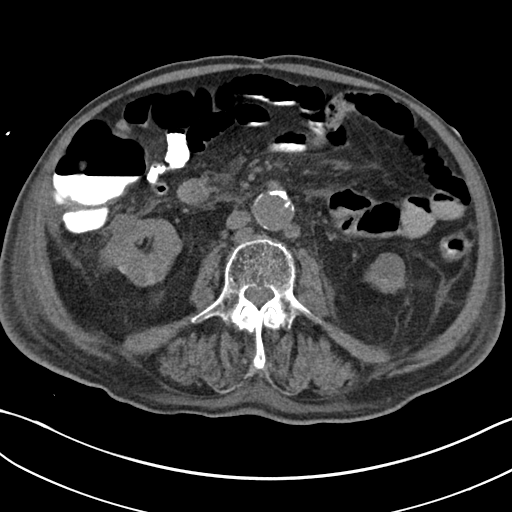
[im 60/98  soft-tissue]
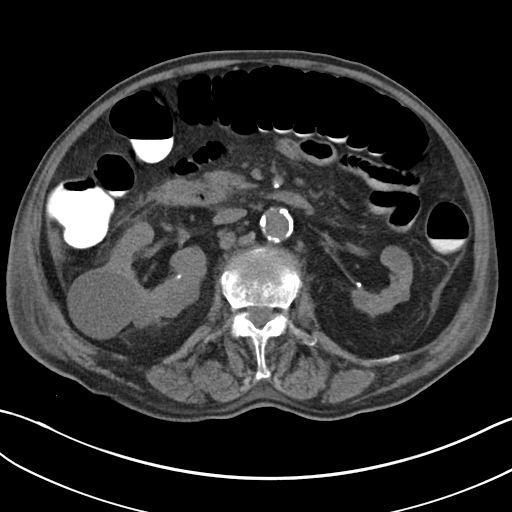
[im 68/98  soft-tissue]
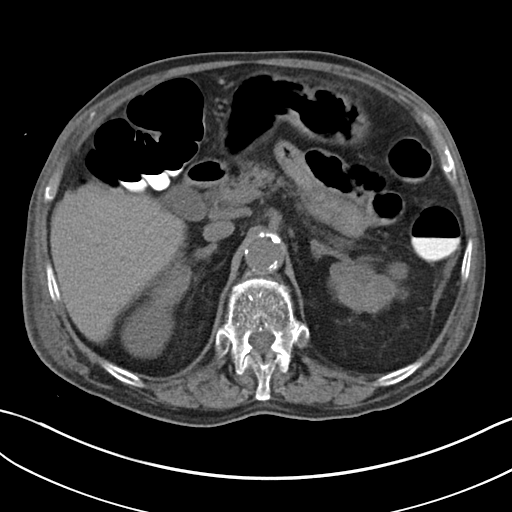
[im 68/98  bone]
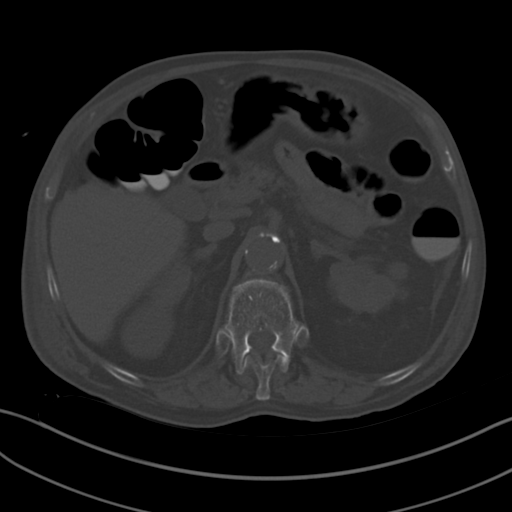
[im 76/98  soft-tissue]
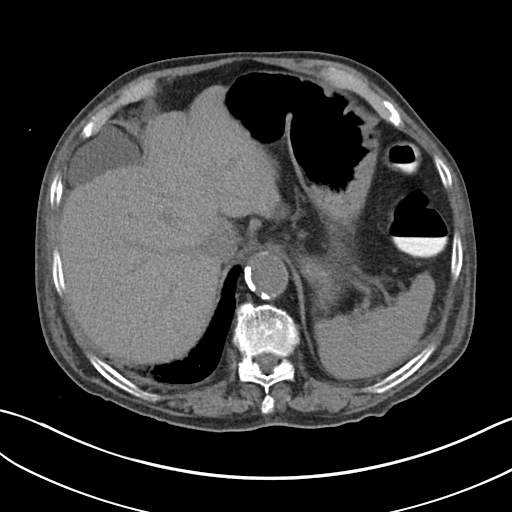
[im 85/98  soft-tissue]
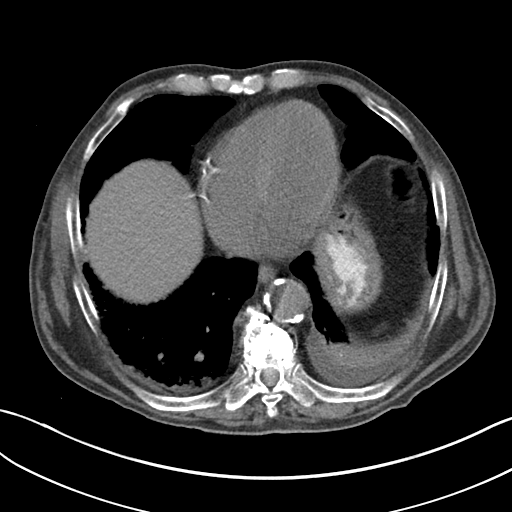
[im 93/98  soft-tissue]
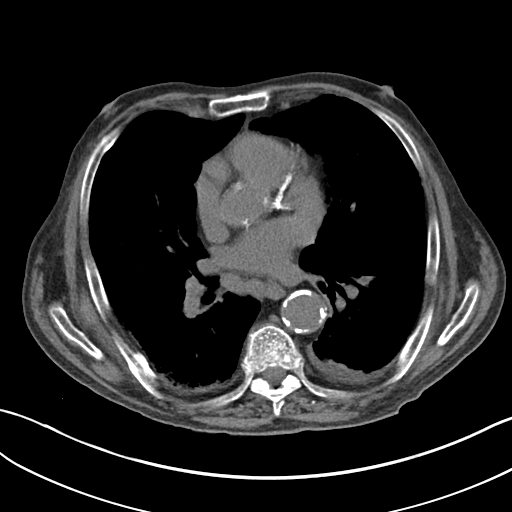

[Series 5: cor st · coronal · 0.73mm/px · 3 of 101 slices shown]
[im 34/101  soft-tissue]
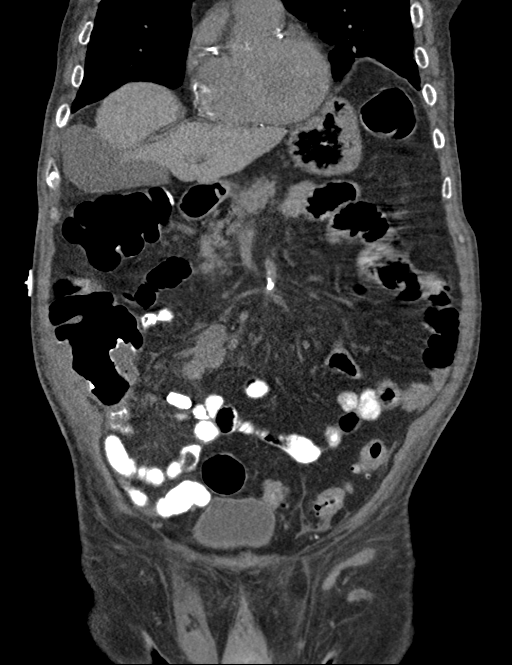
[im 45/101  soft-tissue]
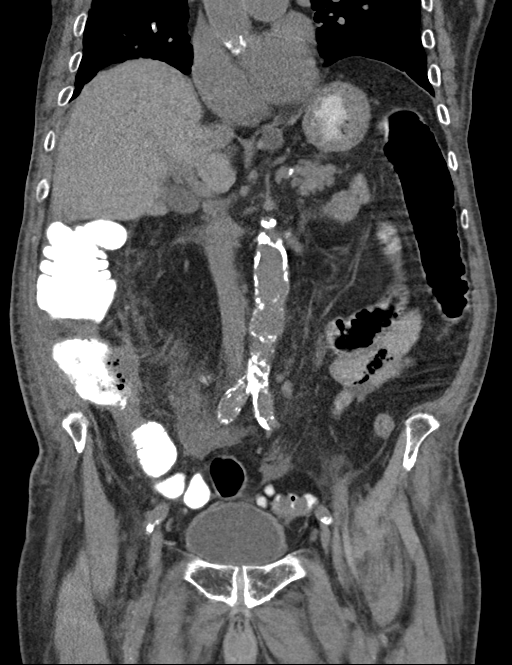
[im 56/101  soft-tissue]
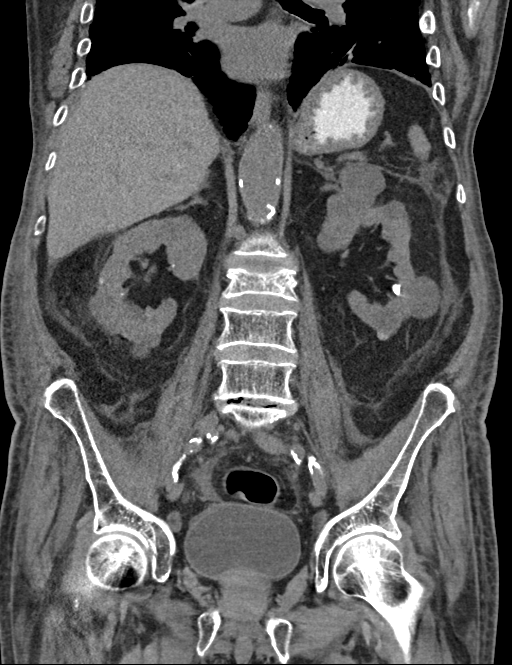

[15 of 46 positions shown; findings below may reference images not displayed]

FINDINGS: Lower chest: Chronic interstitial lung changes. Bibasilar
peribronchial airspace consolidation versus atelectasis. Small left
pleural effusion. Advanced calcific atherosclerotic disease of the
coronary arteries. Calcific atherosclerotic disease of the aorta.

Hepatobiliary: No focal liver abnormality is seen. No gallstones,
gallbladder wall thickening, or biliary dilatation.

Pancreas: Unremarkable. No pancreatic ductal dilatation or
surrounding inflammatory changes.

Spleen: Normal in size without focal abnormality.

Adrenals/Urinary Tract: Adrenal glands demonstrate mild symmetric
thickening. No evidence of hydronephrosis. Bilateral nonobstructive
nephrolithiasis with the largest left lower pole renal stone
measuring 13 mm. Bilateral renal masses, incompletely evaluated due
to lack of IV contrast.

Stomach/Bowel: Stomach is within normal limits. No evidence of
abnormal bowel dilation. Scattered left colonic diverticulosis. Mild
circumferential mucosal thickening of the proximal descending colon.
Irregular mucosal thickening of the cecum. No free gas or oral
contrast extravasation into the abdomen.

Vascular/Lymphatic: Right central mesenteric soft tissue masses
likely represent conglomerate of abnormal lymph nodes the larger of
the 2 measures 3.3 by 2.4 by 4.9 cm. Best seen on coronal image
40/sequence 5. Other smaller mesenteric lymph nodes, as well as
retroperitoneal periaortic and pericaval lymph nodes are also seen.
There is mesenteric stranding, predominantly in the right lower
quadrant of the abdomen and small amount of free fluid.

Calcific atherosclerotic disease of the aorta with mild fusiform
dilation of the infrarenal aorta measuring up to 2.8 cm in diameter.

Reproductive: Mildly enlarged prostate gland.

Other: Fat containing right inguinal hernia.  Mild anasarca.

Musculoskeletal: Age-indeterminate compression fracture of L2
vertebral body with approximately 50% height loss. Prior T9
vertebroplasty.
IMPRESSION: Irregular mucosal thickening of the cecum with pericecal mesenteric
inflammatory changes and small amount of free fluid in the right
lower quadrant of the abdomen. No evidence of contrast extravasation
or free intra- abdominal gas. These findings may be due to cancer of
the cecum or focal colitis. Given presence of 2 right central
mesenteric soft tissue masses, likely representing lymphadenopathy,
malignancy is of primary consideration.

Mild smooth mucosal thickening of the descending colon, nonspecific.

Scattered colonic diverticulosis.

Calcific atherosclerotic disease of the aorta with mild fusiform
aneurysmal dilation of the infrarenal aorta.

Age-indeterminate compression fracture of L2 vertebral body.

Bilateral renal masses, incompletely evaluated due to lack of IV
contrast.

Chronic interstitial lung changes, bibasilar peribronchial airspace
disease versus atelectasis. Small left pleural effusion.
